# Patient Record
Sex: Female | Born: 1960 | Race: White | Hispanic: No | Marital: Single | Smoking: Never smoker
Health system: Southern US, Community
[De-identification: ages and names within clinical notes are randomized; demographics above are authoritative.]

## PROBLEM LIST (undated history)

## (undated) DIAGNOSIS — Z9221 Personal history of antineoplastic chemotherapy: Secondary | ICD-10-CM

## (undated) DIAGNOSIS — H269 Unspecified cataract: Secondary | ICD-10-CM

## (undated) DIAGNOSIS — R1310 Dysphagia, unspecified: Secondary | ICD-10-CM

## (undated) DIAGNOSIS — M1990 Unspecified osteoarthritis, unspecified site: Secondary | ICD-10-CM

## (undated) DIAGNOSIS — R45851 Suicidal ideations: Secondary | ICD-10-CM

## (undated) DIAGNOSIS — D0510 Intraductal carcinoma in situ of unspecified breast: Secondary | ICD-10-CM

## (undated) DIAGNOSIS — Z923 Personal history of irradiation: Secondary | ICD-10-CM

## (undated) DIAGNOSIS — K219 Gastro-esophageal reflux disease without esophagitis: Secondary | ICD-10-CM

## (undated) DIAGNOSIS — F32A Depression, unspecified: Secondary | ICD-10-CM

## (undated) DIAGNOSIS — C50919 Malignant neoplasm of unspecified site of unspecified female breast: Secondary | ICD-10-CM

## (undated) DIAGNOSIS — C419 Malignant neoplasm of bone and articular cartilage, unspecified: Secondary | ICD-10-CM

## (undated) HISTORY — DX: Suicidal ideations: R45.851

## (undated) HISTORY — PX: EYE SURGERY: SHX253

## (undated) HISTORY — DX: Malignant neoplasm of bone and articular cartilage, unspecified: C41.9

## (undated) HISTORY — DX: Unspecified osteoarthritis, unspecified site: M19.90

## (undated) HISTORY — DX: Gastro-esophageal reflux disease without esophagitis: K21.9

## (undated) HISTORY — PX: SPINE SURGERY: SHX786

## (undated) HISTORY — PX: BREAST LUMPECTOMY: SHX2

## (undated) HISTORY — DX: Intraductal carcinoma in situ of unspecified breast: D05.10

## (undated) HISTORY — DX: Depression, unspecified: F32.A

## (undated) HISTORY — PX: BACK SURGERY: SHX140

## (undated) HISTORY — DX: Dysphagia, unspecified: R13.10

## (undated) HISTORY — PX: BREAST BIOPSY: SHX20

## (undated) HISTORY — DX: Unspecified cataract: H26.9

## (undated) HISTORY — PX: BREAST SURGERY: SHX581

---

## 1998-02-04 ENCOUNTER — Ambulatory Visit (HOSPITAL_BASED_OUTPATIENT_CLINIC_OR_DEPARTMENT_OTHER): Admission: RE | Admit: 1998-02-04 | Discharge: 1998-02-04 | Payer: Self-pay

## 1998-02-14 ENCOUNTER — Ambulatory Visit (HOSPITAL_COMMUNITY): Admission: RE | Admit: 1998-02-14 | Discharge: 1998-02-14 | Payer: Self-pay

## 1998-10-22 ENCOUNTER — Encounter: Admission: RE | Admit: 1998-10-22 | Discharge: 1999-01-20 | Payer: Self-pay | Admitting: Radiation Oncology

## 1998-12-03 ENCOUNTER — Ambulatory Visit (HOSPITAL_COMMUNITY): Admission: RE | Admit: 1998-12-03 | Discharge: 1998-12-03 | Payer: Self-pay | Admitting: *Deleted

## 1999-02-27 ENCOUNTER — Ambulatory Visit (HOSPITAL_COMMUNITY): Admission: RE | Admit: 1999-02-27 | Discharge: 1999-02-27 | Payer: Self-pay | Admitting: Oncology

## 1999-02-27 ENCOUNTER — Encounter: Payer: Self-pay | Admitting: Oncology

## 1999-08-29 ENCOUNTER — Ambulatory Visit (HOSPITAL_COMMUNITY): Admission: RE | Admit: 1999-08-29 | Discharge: 1999-08-29 | Payer: Self-pay | Admitting: Oncology

## 1999-08-29 ENCOUNTER — Encounter: Payer: Self-pay | Admitting: Oncology

## 2000-04-13 ENCOUNTER — Other Ambulatory Visit: Admission: RE | Admit: 2000-04-13 | Discharge: 2000-04-13 | Payer: Self-pay | Admitting: Obstetrics and Gynecology

## 2000-05-17 ENCOUNTER — Encounter: Admission: RE | Admit: 2000-05-17 | Discharge: 2000-05-17 | Payer: Self-pay | Admitting: *Deleted

## 2000-05-17 ENCOUNTER — Encounter: Payer: Self-pay | Admitting: Oncology

## 2001-04-15 ENCOUNTER — Other Ambulatory Visit: Admission: RE | Admit: 2001-04-15 | Discharge: 2001-04-15 | Payer: Self-pay | Admitting: Obstetrics and Gynecology

## 2001-05-19 ENCOUNTER — Encounter: Payer: Self-pay | Admitting: Obstetrics and Gynecology

## 2001-05-19 ENCOUNTER — Encounter: Admission: RE | Admit: 2001-05-19 | Discharge: 2001-05-19 | Payer: Self-pay | Admitting: Obstetrics and Gynecology

## 2001-10-13 ENCOUNTER — Encounter: Payer: Self-pay | Admitting: Oncology

## 2001-10-13 ENCOUNTER — Ambulatory Visit (HOSPITAL_COMMUNITY): Admission: RE | Admit: 2001-10-13 | Discharge: 2001-10-13 | Payer: Self-pay | Admitting: Oncology

## 2001-12-01 ENCOUNTER — Encounter (INDEPENDENT_AMBULATORY_CARE_PROVIDER_SITE_OTHER): Payer: Self-pay | Admitting: Specialist

## 2001-12-01 ENCOUNTER — Ambulatory Visit (HOSPITAL_COMMUNITY): Admission: RE | Admit: 2001-12-01 | Discharge: 2001-12-01 | Payer: Self-pay | Admitting: Obstetrics and Gynecology

## 2002-05-22 ENCOUNTER — Encounter: Admission: RE | Admit: 2002-05-22 | Discharge: 2002-05-22 | Payer: Self-pay | Admitting: Oncology

## 2002-05-22 ENCOUNTER — Other Ambulatory Visit: Admission: RE | Admit: 2002-05-22 | Discharge: 2002-05-22 | Payer: Self-pay | Admitting: Diagnostic Radiology

## 2002-05-22 ENCOUNTER — Encounter: Payer: Self-pay | Admitting: Oncology

## 2002-05-22 ENCOUNTER — Encounter (INDEPENDENT_AMBULATORY_CARE_PROVIDER_SITE_OTHER): Payer: Self-pay | Admitting: *Deleted

## 2002-07-19 ENCOUNTER — Encounter: Admission: RE | Admit: 2002-07-19 | Discharge: 2002-07-19 | Payer: Self-pay | Admitting: *Deleted

## 2002-07-19 ENCOUNTER — Ambulatory Visit (HOSPITAL_BASED_OUTPATIENT_CLINIC_OR_DEPARTMENT_OTHER): Admission: RE | Admit: 2002-07-19 | Discharge: 2002-07-19 | Payer: Self-pay | Admitting: *Deleted

## 2002-07-19 ENCOUNTER — Encounter (INDEPENDENT_AMBULATORY_CARE_PROVIDER_SITE_OTHER): Payer: Self-pay | Admitting: *Deleted

## 2002-08-07 ENCOUNTER — Ambulatory Visit: Admission: RE | Admit: 2002-08-07 | Discharge: 2002-10-17 | Payer: Self-pay | Admitting: Radiation Oncology

## 2002-10-11 ENCOUNTER — Encounter: Admission: RE | Admit: 2002-10-11 | Discharge: 2002-10-11 | Payer: Self-pay | Admitting: Oncology

## 2002-10-11 ENCOUNTER — Encounter: Payer: Self-pay | Admitting: Oncology

## 2003-05-09 ENCOUNTER — Ambulatory Visit (HOSPITAL_COMMUNITY): Admission: RE | Admit: 2003-05-09 | Discharge: 2003-05-09 | Payer: Self-pay | Admitting: Obstetrics and Gynecology

## 2003-05-09 ENCOUNTER — Encounter (INDEPENDENT_AMBULATORY_CARE_PROVIDER_SITE_OTHER): Payer: Self-pay | Admitting: Specialist

## 2003-05-24 ENCOUNTER — Encounter: Payer: Self-pay | Admitting: Oncology

## 2003-05-24 ENCOUNTER — Encounter: Admission: RE | Admit: 2003-05-24 | Discharge: 2003-05-24 | Payer: Self-pay | Admitting: Oncology

## 2005-02-19 ENCOUNTER — Encounter: Admission: RE | Admit: 2005-02-19 | Discharge: 2005-02-19 | Payer: Self-pay | Admitting: Oncology

## 2005-02-24 ENCOUNTER — Ambulatory Visit (HOSPITAL_COMMUNITY): Admission: RE | Admit: 2005-02-24 | Discharge: 2005-02-24 | Payer: Self-pay | Admitting: Oncology

## 2005-03-02 ENCOUNTER — Ambulatory Visit: Payer: Self-pay | Admitting: Oncology

## 2005-09-07 ENCOUNTER — Ambulatory Visit: Payer: Self-pay | Admitting: Oncology

## 2006-02-24 ENCOUNTER — Encounter: Admission: RE | Admit: 2006-02-24 | Discharge: 2006-02-24 | Payer: Self-pay | Admitting: Oncology

## 2006-03-05 ENCOUNTER — Ambulatory Visit: Payer: Self-pay | Admitting: Oncology

## 2006-03-08 LAB — CBC WITH DIFFERENTIAL/PLATELET
Eosinophils Absolute: 0.2 10*3/uL (ref 0.0–0.5)
LYMPH%: 25.4 % (ref 14.0–48.0)
MCHC: 33.5 g/dL (ref 32.0–36.0)
MCV: 80.2 fL — ABNORMAL LOW (ref 81.0–101.0)
MONO%: 7.4 % (ref 0.0–13.0)
NEUT#: 3.2 10*3/uL (ref 1.5–6.5)
NEUT%: 62.1 % (ref 39.6–76.8)
Platelets: 213 10*3/uL (ref 145–400)
RBC: 4.57 10*6/uL (ref 3.70–5.32)

## 2006-03-08 LAB — COMPREHENSIVE METABOLIC PANEL
ALT: 20 U/L (ref 0–40)
Alkaline Phosphatase: 75 U/L (ref 39–117)
CO2: 29 mEq/L (ref 19–32)
Creatinine, Ser: 0.91 mg/dL (ref 0.40–1.20)
Sodium: 141 mEq/L (ref 135–145)
Total Bilirubin: 0.3 mg/dL (ref 0.3–1.2)
Total Protein: 7.3 g/dL (ref 6.0–8.3)

## 2006-03-08 LAB — LACTATE DEHYDROGENASE: LDH: 179 U/L (ref 94–250)

## 2006-03-18 ENCOUNTER — Ambulatory Visit (HOSPITAL_COMMUNITY): Admission: RE | Admit: 2006-03-18 | Discharge: 2006-03-18 | Payer: Self-pay | Admitting: Oncology

## 2006-09-03 ENCOUNTER — Ambulatory Visit: Payer: Self-pay | Admitting: Oncology

## 2006-09-07 LAB — COMPREHENSIVE METABOLIC PANEL
AST: 16 U/L (ref 0–37)
Albumin: 4.4 g/dL (ref 3.5–5.2)
Alkaline Phosphatase: 85 U/L (ref 39–117)
Potassium: 4.4 mEq/L (ref 3.5–5.3)
Sodium: 140 mEq/L (ref 135–145)
Total Protein: 7.2 g/dL (ref 6.0–8.3)

## 2006-09-07 LAB — CBC WITH DIFFERENTIAL/PLATELET
EOS%: 2.5 % (ref 0.0–7.0)
MCH: 26.9 pg (ref 26.0–34.0)
MCV: 80.3 fL — ABNORMAL LOW (ref 81.0–101.0)
MONO%: 6.3 % (ref 0.0–13.0)
NEUT#: 6.5 10*3/uL (ref 1.5–6.5)
RBC: 4.58 10*6/uL (ref 3.70–5.32)
RDW: 14.2 % (ref 11.3–14.5)

## 2007-02-28 ENCOUNTER — Encounter: Admission: RE | Admit: 2007-02-28 | Discharge: 2007-02-28 | Payer: Self-pay | Admitting: Oncology

## 2007-03-03 ENCOUNTER — Ambulatory Visit: Payer: Self-pay | Admitting: Oncology

## 2007-03-08 ENCOUNTER — Ambulatory Visit (HOSPITAL_COMMUNITY): Admission: RE | Admit: 2007-03-08 | Discharge: 2007-03-08 | Payer: Self-pay | Admitting: Oncology

## 2007-03-08 LAB — CBC WITH DIFFERENTIAL/PLATELET
Basophils Absolute: 0 10*3/uL (ref 0.0–0.1)
HCT: 36.2 % (ref 34.8–46.6)
HGB: 12.5 g/dL (ref 11.6–15.9)
LYMPH%: 21 % (ref 14.0–48.0)
MCH: 27.1 pg (ref 26.0–34.0)
MCHC: 34.5 g/dL (ref 32.0–36.0)
MONO#: 0.6 10*3/uL (ref 0.1–0.9)
NEUT%: 67.9 % (ref 39.6–76.8)
Platelets: 226 10*3/uL (ref 145–400)
WBC: 7.8 10*3/uL (ref 3.9–10.0)
lymph#: 1.6 10*3/uL (ref 0.9–3.3)

## 2007-03-08 LAB — COMPREHENSIVE METABOLIC PANEL
BUN: 17 mg/dL (ref 6–23)
CO2: 26 mEq/L (ref 19–32)
Calcium: 9.6 mg/dL (ref 8.4–10.5)
Chloride: 100 mEq/L (ref 96–112)
Creatinine, Ser: 0.91 mg/dL (ref 0.40–1.20)
Total Bilirubin: 0.3 mg/dL (ref 0.3–1.2)

## 2007-03-08 LAB — LACTATE DEHYDROGENASE: LDH: 184 U/L (ref 94–250)

## 2007-03-21 ENCOUNTER — Ambulatory Visit (HOSPITAL_COMMUNITY): Admission: RE | Admit: 2007-03-21 | Discharge: 2007-03-21 | Payer: Self-pay | Admitting: Family Medicine

## 2008-03-01 ENCOUNTER — Encounter: Admission: RE | Admit: 2008-03-01 | Discharge: 2008-03-01 | Payer: Self-pay | Admitting: Oncology

## 2008-03-02 ENCOUNTER — Ambulatory Visit: Payer: Self-pay | Admitting: Oncology

## 2008-03-06 ENCOUNTER — Ambulatory Visit (HOSPITAL_COMMUNITY): Admission: RE | Admit: 2008-03-06 | Discharge: 2008-03-06 | Payer: Self-pay | Admitting: Oncology

## 2008-03-06 LAB — CBC WITH DIFFERENTIAL/PLATELET
BASO%: 0.4 % (ref 0.0–2.0)
EOS%: 3.7 % (ref 0.0–7.0)
HCT: 39.4 % (ref 34.8–46.6)
HGB: 13.3 g/dL (ref 11.6–15.9)
MCHC: 33.7 g/dL (ref 32.0–36.0)
MONO#: 0.5 10*3/uL (ref 0.1–0.9)
MONO%: 7.3 % (ref 0.0–13.0)
NEUT#: 4.3 10*3/uL (ref 1.5–6.5)
NEUT%: 57.9 % (ref 39.6–76.8)
Platelets: 238 10*3/uL (ref 145–400)
lymph#: 2.3 10*3/uL (ref 0.9–3.3)

## 2008-03-06 LAB — COMPREHENSIVE METABOLIC PANEL
Albumin: 4.3 g/dL (ref 3.5–5.2)
CO2: 26 mEq/L (ref 19–32)
Calcium: 10.1 mg/dL (ref 8.4–10.5)
Chloride: 100 mEq/L (ref 96–112)
Creatinine, Ser: 0.86 mg/dL (ref 0.40–1.20)
Glucose, Bld: 83 mg/dL (ref 70–99)

## 2009-03-04 ENCOUNTER — Encounter: Admission: RE | Admit: 2009-03-04 | Discharge: 2009-03-04 | Payer: Self-pay | Admitting: Oncology

## 2009-03-08 ENCOUNTER — Ambulatory Visit: Payer: Self-pay | Admitting: Oncology

## 2009-03-12 LAB — CBC WITH DIFFERENTIAL/PLATELET
EOS%: 3 % (ref 0.0–7.0)
HCT: 37.8 % (ref 34.8–46.6)
HGB: 12.8 g/dL (ref 11.6–15.9)
MCH: 27.1 pg (ref 25.1–34.0)
MCV: 79.8 fL (ref 79.5–101.0)
MONO%: 5.8 % (ref 0.0–14.0)
NEUT#: 4.2 10*3/uL (ref 1.5–6.5)
Platelets: 216 10*3/uL (ref 145–400)
RBC: 4.74 10*6/uL (ref 3.70–5.45)

## 2009-03-12 LAB — COMPREHENSIVE METABOLIC PANEL
ALT: 16 U/L (ref 0–35)
Albumin: 4.2 g/dL (ref 3.5–5.2)
BUN: 18 mg/dL (ref 6–23)
CO2: 25 mEq/L (ref 19–32)
Creatinine, Ser: 0.96 mg/dL (ref 0.40–1.20)
Sodium: 141 mEq/L (ref 135–145)
Total Bilirubin: 0.2 mg/dL — ABNORMAL LOW (ref 0.3–1.2)
Total Protein: 7.3 g/dL (ref 6.0–8.3)

## 2009-03-12 LAB — LACTATE DEHYDROGENASE: LDH: 157 U/L (ref 94–250)

## 2009-06-27 ENCOUNTER — Ambulatory Visit: Payer: Self-pay | Admitting: Diagnostic Radiology

## 2009-06-27 ENCOUNTER — Emergency Department (HOSPITAL_BASED_OUTPATIENT_CLINIC_OR_DEPARTMENT_OTHER): Admission: EM | Admit: 2009-06-27 | Discharge: 2009-06-27 | Payer: Self-pay | Admitting: Emergency Medicine

## 2010-03-07 ENCOUNTER — Encounter: Admission: RE | Admit: 2010-03-07 | Discharge: 2010-03-07 | Payer: Self-pay | Admitting: Oncology

## 2010-03-21 ENCOUNTER — Ambulatory Visit: Payer: Self-pay | Admitting: Oncology

## 2010-03-21 LAB — CBC WITH DIFFERENTIAL/PLATELET
BASO%: 0.4 % (ref 0.0–2.0)
Basophils Absolute: 0 10*3/uL (ref 0.0–0.1)
HGB: 11.9 g/dL (ref 11.6–15.9)
LYMPH%: 23.1 % (ref 14.0–49.7)
MCHC: 32.9 g/dL (ref 31.5–36.0)
MONO#: 0.4 10*3/uL (ref 0.1–0.9)
MONO%: 6.4 % (ref 0.0–14.0)
NEUT#: 3.9 10*3/uL (ref 1.5–6.5)
NEUT%: 67.1 % (ref 38.4–76.8)

## 2010-03-22 LAB — COMPREHENSIVE METABOLIC PANEL
Alkaline Phosphatase: 61 U/L (ref 39–117)
BUN: 23 mg/dL (ref 6–23)
CO2: 23 mEq/L (ref 19–32)
Potassium: 4 mEq/L (ref 3.5–5.3)
Sodium: 141 mEq/L (ref 135–145)
Total Protein: 7 g/dL (ref 6.0–8.3)

## 2010-03-22 LAB — LACTATE DEHYDROGENASE: LDH: 188 U/L (ref 94–250)

## 2010-03-22 LAB — VITAMIN D 25 HYDROXY (VIT D DEFICIENCY, FRACTURES): Vit D, 25-Hydroxy: 30 ng/mL (ref 30–89)

## 2010-10-18 ENCOUNTER — Other Ambulatory Visit: Payer: Self-pay | Admitting: Oncology

## 2010-10-18 DIAGNOSIS — Z1239 Encounter for other screening for malignant neoplasm of breast: Secondary | ICD-10-CM

## 2010-10-19 ENCOUNTER — Encounter: Payer: Self-pay | Admitting: Oncology

## 2010-10-20 ENCOUNTER — Encounter: Payer: Self-pay | Admitting: Oncology

## 2011-02-13 NOTE — Op Note (Signed)
   NAME:  Rhonda Keller, Rhonda Keller                          ACCOUNT NO.:  [id number]   MEDICAL RECORD NO.:  [id number]                   PATIENT TYPE:  AMB   LOCATION:  DSC                                  FACILITY:  [facility]   PHYSICIAN:  Vikki Ports, M.D.         DATE OF BIRTH:  10-16-1960   DATE OF PROCEDURE:  07/19/2002  DATE OF DISCHARGE:  07/19/2002                                 OPERATIVE REPORT   PREOPERATIVE DIAGNOSIS:  Left breast cancer.   POSTOPERATIVE DIAGNOSIS:  Left breast cancer.   PROCEDURES:  1. Needle-localization left partial mastectomy.  2. Lymphatic mapping.  3. Blue dye injection.  4. Left axillary sentinel lymph node biopsy.   SURGEON:  Vikki Ports, M.D.   ANESTHESIA:  General.   DESCRIPTION OF PROCEDURE:  The patient was taken to the operating room and  placed in the supine position.  After adequate general anesthesia was  induced, 5 cc of Lymphazurin blue dye was injected in the retroareolar  position.  Using the Neoprobe, an area of high activity was identified in  the left axilla.  A transverse incision was made just lateral to the lateral  border of the pectoralis major muscle.  I dissected down through the  subcutaneous fat and retracted the lateral border of the pectoralis muscle  medially.  Two separate blue, hot lymph nodes were identified and excised.  Small lymphatic tributaries were clipped and coagulated.  Lymph nodes were  sent for pathologic evaluation after verification of the Neoprobe had very  high counts in the 3000-4000 range.  Touch prep biopsy came back negative  for malignancy.  That wound was closed then with a subcuticular 4-0  Monocryl.   The area that had previously been localized by a wire was identified.  A  curvilinear incision was made in the lower inner quadrant of the left  breast.  I dissected down, excising all tissue around the wire en bloc.  This was sent for  specimen mammography, which verified the  presence of the density.  Adequate  hemostasis was ensured, and this incision was closed with subcuticular 4-0  Monocryl.  Steri-Strips and sterile dressings were applied.  The patient  tolerated the procedure well and went to the PACU in good condition.                                                Vikki Ports, M.D.    KRH/MEDQ  D:  07/22/2002  T:  07/24/2002  Job:  [id number]   cc:   Genene Churn. Cyndie Chime, M.D.  [address] [facility]  [place]  [place] [address]  Fax: [phone]

## 2011-02-13 NOTE — Op Note (Signed)
[facility]  Patient:    [name], [name] Visit Number: [id number] MRN: [id number]          Service Type: DSU Location: DAY Attending Physician:  Rosalee Kaufman Dictated by:   Harl Bowie, M.D. Proc. Date: 12/01/01 Admit Date:  12/01/2001   CC:         Genene Churn. Cyndie Chime, M.D.  Loyal Jacobson, M.D.   Operative Report  PREOPERATIVE DIAGNOSIS:  Postmenopausal bleeding.  POSTOPERATIVE DIAGNOSIS:  Postmenopausal bleeding.  OPERATION:  D&C.  SURGEON:  Harl Bowie, M.D.  ANESTHESIA:  MAC.  FINDINGS AND PROCEDURE:  The patient prepped and draped in the usual fashion for a vaginal procedure.  The patient examined and found to have a uterus of normal size with the adnexa clear.  Following, with some difficulty, a Semb speculum was placed in the vagina.  The cervix was grasped with a single-tooth tenaculum.  With a very small dilator, the cervix was sounded to 2.5 inches. The cervix was dilated up to a size 15 dilator and the cavity entered with a small curette.  A small amount of tissue was obtained.  No additional tissue was obtained with a Novak curette.  Blood loss during the procedure was minimal.  The patient tolerated the procedure well and sent to the recovery room in good condition. Dictated by:   Harl Bowie, M.D. Attending Physician:  Rosalee Kaufman DD:  12/01/01 TD:  12/01/01 Job: [id number]

## 2011-02-13 NOTE — Op Note (Signed)
   NAME:  Rhonda Keller, Rhonda Keller                          ACCOUNT NO.:  [id number]   MEDICAL RECORD NO.:  [id number]                   PATIENT TYPE:  AMB   LOCATION:  DAY                                  FACILITY:  [facility]   PHYSICIAN:  Leona Singleton, M.D.                  DATE OF BIRTH:  12/18/1960   DATE OF PROCEDURE:  05/09/2003  DATE OF DISCHARGE:                                 OPERATIVE REPORT   PREOPERATIVE DIAGNOSIS:  Postmenopausal bleeding.   POSTOPERATIVE DIAGNOSIS:  Postmenopausal bleeding.   OPERATION/PROCEDURE:  Dilatation and curettage.   SURGEON:  Leona Singleton, M.D.   ANESTHESIA:  MAC with local.   DESCRIPTION OF PROCEDURE:  The patient was prepped and draped in the usual  fashion for a vaginal procedure.  The patient was examined to have uterus  normal size with adnexa clear.  Following this, a speculum was placed in the  vagina and cervix was grasped with a single-tooth tenaculum.  Cervix was  then sounded to three inches.  Cervix was dilated and the cavity was entered  with a small curet.  Small amount of tissue was obtained.  No additional was  obtained with the round Stone forceps and serrated curet.  The patient  tolerated the procedure well.  Blood loss was minimal.  The patient was sent  to the recovery room in good condition.                                               Leona Singleton, M.D.    KB/MEDQ  D:  05/09/2003  T:  05/09/2003  Job:  [id number]

## 2011-03-10 ENCOUNTER — Ambulatory Visit
Admission: RE | Admit: 2011-03-10 | Discharge: 2011-03-10 | Disposition: A | Discharge status: Home / Self Care | Payer: 59 | Source: Ambulatory Visit | Attending: Oncology | Admitting: Oncology

## 2011-03-10 DIAGNOSIS — Z1239 Encounter for other screening for malignant neoplasm of breast: Secondary | ICD-10-CM

## 2011-03-24 ENCOUNTER — Other Ambulatory Visit: Payer: Self-pay | Admitting: Oncology

## 2011-03-24 ENCOUNTER — Ambulatory Visit: Payer: Self-pay

## 2011-03-24 ENCOUNTER — Encounter (HOSPITAL_BASED_OUTPATIENT_CLINIC_OR_DEPARTMENT_OTHER): Payer: 59 | Admitting: Oncology

## 2011-03-24 DIAGNOSIS — Z171 Estrogen receptor negative status [ER-]: Secondary | ICD-10-CM

## 2011-03-24 DIAGNOSIS — C50919 Malignant neoplasm of unspecified site of unspecified female breast: Secondary | ICD-10-CM

## 2011-03-24 LAB — CBC WITH DIFFERENTIAL/PLATELET
EOS%: 2.7 % (ref 0.0–7.0)
Eosinophils Absolute: 0.2 10*3/uL (ref 0.0–0.5)
MCH: 25.7 pg (ref 25.1–34.0)
MCHC: 32.7 g/dL (ref 31.5–36.0)
MONO#: 0.4 10*3/uL (ref 0.1–0.9)
MONO%: 5.6 % (ref 0.0–14.0)
RBC: 4.72 10*6/uL (ref 3.70–5.45)
RDW: 15.1 % — ABNORMAL HIGH (ref 11.2–14.5)
WBC: 7.1 10*3/uL (ref 3.9–10.3)
lymph#: 1.2 10*3/uL (ref 0.9–3.3)

## 2011-03-24 LAB — COMPREHENSIVE METABOLIC PANEL
CO2: 28 mEq/L (ref 19–32)
Chloride: 103 mEq/L (ref 96–112)
Glucose, Bld: 105 mg/dL — ABNORMAL HIGH (ref 70–99)
Potassium: 4.5 mEq/L (ref 3.5–5.3)

## 2011-03-24 LAB — LACTATE DEHYDROGENASE: LDH: 190 U/L (ref 94–250)

## 2011-03-31 ENCOUNTER — Encounter (HOSPITAL_BASED_OUTPATIENT_CLINIC_OR_DEPARTMENT_OTHER): Payer: 59 | Admitting: Oncology

## 2011-03-31 ENCOUNTER — Other Ambulatory Visit: Payer: Self-pay | Admitting: Oncology

## 2011-03-31 DIAGNOSIS — Z1231 Encounter for screening mammogram for malignant neoplasm of breast: Secondary | ICD-10-CM

## 2011-03-31 DIAGNOSIS — C412 Malignant neoplasm of vertebral column: Secondary | ICD-10-CM

## 2011-03-31 DIAGNOSIS — C50919 Malignant neoplasm of unspecified site of unspecified female breast: Secondary | ICD-10-CM

## 2011-11-02 ENCOUNTER — Telehealth: Payer: Self-pay | Admitting: Oncology

## 2011-11-02 NOTE — Telephone Encounter (Signed)
lmonvm adviisng the pt of her July 2013 appt °

## 2012-03-14 ENCOUNTER — Ambulatory Visit: Payer: 59

## 2012-03-17 ENCOUNTER — Ambulatory Visit
Admission: RE | Admit: 2012-03-17 | Discharge: 2012-03-17 | Disposition: A | Discharge status: Home / Self Care | Payer: PRIVATE HEALTH INSURANCE | Source: Ambulatory Visit | Attending: Oncology | Admitting: Oncology

## 2012-03-17 DIAGNOSIS — Z1231 Encounter for screening mammogram for malignant neoplasm of breast: Secondary | ICD-10-CM

## 2012-03-23 ENCOUNTER — Other Ambulatory Visit (HOSPITAL_BASED_OUTPATIENT_CLINIC_OR_DEPARTMENT_OTHER): Payer: PRIVATE HEALTH INSURANCE | Admitting: Lab

## 2012-03-23 DIAGNOSIS — C50919 Malignant neoplasm of unspecified site of unspecified female breast: Secondary | ICD-10-CM

## 2012-03-23 LAB — COMPREHENSIVE METABOLIC PANEL
Albumin: 4.4 g/dL (ref 3.5–5.2)
BUN: 16 mg/dL (ref 6–23)
CO2: 26 mEq/L (ref 19–32)
Glucose, Bld: 84 mg/dL (ref 70–99)
Potassium: 4.3 mEq/L (ref 3.5–5.3)
Sodium: 140 mEq/L (ref 135–145)
Total Bilirubin: 0.3 mg/dL (ref 0.3–1.2)
Total Protein: 7.3 g/dL (ref 6.0–8.3)

## 2012-03-23 LAB — CBC WITH DIFFERENTIAL/PLATELET
Basophils Absolute: 0.1 10*3/uL (ref 0.0–0.1)
Eosinophils Absolute: 0.2 10*3/uL (ref 0.0–0.5)
HGB: 11.7 g/dL (ref 11.6–15.9)
LYMPH%: 24.9 % (ref 14.0–49.7)
MCV: 79.1 fL — ABNORMAL LOW (ref 79.5–101.0)
MONO#: 0.5 10*3/uL (ref 0.1–0.9)
NEUT#: 4.4 10*3/uL (ref 1.5–6.5)
Platelets: 199 10*3/uL (ref 145–400)
RBC: 4.65 10*6/uL (ref 3.70–5.45)
WBC: 6.8 10*3/uL (ref 3.9–10.3)

## 2012-03-23 LAB — LACTATE DEHYDROGENASE: LDH: 160 U/L (ref 94–250)

## 2012-03-29 ENCOUNTER — Encounter: Payer: Self-pay | Admitting: Oncology

## 2012-03-29 ENCOUNTER — Ambulatory Visit (HOSPITAL_BASED_OUTPATIENT_CLINIC_OR_DEPARTMENT_OTHER): Payer: PRIVATE HEALTH INSURANCE | Admitting: Oncology

## 2012-03-29 ENCOUNTER — Telehealth: Payer: Self-pay | Admitting: Oncology

## 2012-03-29 VITALS — BP 126/63 | HR 86 | Temp 97.7°F | Ht 62.0 in | Wt 210.0 lb

## 2012-03-29 DIAGNOSIS — R1310 Dysphagia, unspecified: Secondary | ICD-10-CM

## 2012-03-29 DIAGNOSIS — D0590 Unspecified type of carcinoma in situ of unspecified breast: Secondary | ICD-10-CM

## 2012-03-29 DIAGNOSIS — C419 Malignant neoplasm of bone and articular cartilage, unspecified: Secondary | ICD-10-CM

## 2012-03-29 DIAGNOSIS — C50919 Malignant neoplasm of unspecified site of unspecified female breast: Secondary | ICD-10-CM | POA: Insufficient documentation

## 2012-03-29 DIAGNOSIS — D0510 Intraductal carcinoma in situ of unspecified breast: Secondary | ICD-10-CM

## 2012-03-29 HISTORY — DX: Malignant neoplasm of bone and articular cartilage, unspecified: C41.9

## 2012-03-29 HISTORY — DX: Intraductal carcinoma in situ of unspecified breast: D05.10

## 2012-03-29 HISTORY — DX: Dysphagia, unspecified: R13.10

## 2012-03-29 NOTE — Progress Notes (Signed)
Hematology and Oncology Follow Up Visit  Rhonda Keller [id number] [birth year] 51 y.o. 03/29/2012 7:08 PM   Principle Diagnosis: Encounter Diagnoses  Name Primary?  . Stage II infiltrating ductal breast carcinoma, ER+ Yes  . DCIS (ductal carcinoma in situ) of breast   . Ewing's sarcoma of bone   . Dysphagia, idiopathic      Interim History:  Followup visit for this pleasant 51 year old woman with an i initial diagnosis of  Ewing's sarcoma,  presenting with impending spinal cord compression due to an epidural mass in September 15, 1984, treated with surgery, radiation, and chemotherapy while she was living in [place].  She subsequently developed a stage II 2.5 cm, ER negative, 1 node positive cancer of the right breast in Feb 13, 1998, treated with lumpectomy, radiation, and chemotherapy with 4 cycles of Taxol followed by 6 cycles of CMF.  She developed a second primary 1.1-cm DCIS cancer in the left breast in August 2003.  ER/PR negative.  Node negative treated with breast radiation. She has had no signs of cancer now for almost 10 years. At time of visit last year she was complaining of dysphagia. She did see a gastroenterologist but could not afford to have endoscopy done. She still having some dysphasia intermittent for solids and times liquids. I told her that we could do a simple barium swallow to see if she in fact has developed a radiation stricture. She wanted getting bilateral breast radiation for her metachronous primary cancers. She is agreeable to this.  She's had no other interim medical problems. She denies any headache or change in vision, bone pain, vaginal bleeding, or change in bowel habit.  Medications: reviewed  Allergies:  Allergies  Allergen Reactions  . Penicillins Itching    Review of Systems: Constitutional:   No constitutional symptoms Respiratory: No cough or dyspnea Cardiovascular:  No chest pain or palpitations Gastrointestinal: No change in bowel  habit Genito-Urinary: See above Musculoskeletal: See above Neurologic: See above Skin: No rash Remaining ROS negative.  Physical Exam: Blood pressure 126/63, pulse 86, temperature 97.7 F (36.5 C), temperature source Oral, height 5\' 2"  (1.575 m), weight 210 lb (95.255 kg). Wt Readings from Last 3 Encounters:  03/29/12 210 lb (95.255 kg)     General appearance: Well-nourished Caucasian woman HENNT: Pharynx no erythema or exudate Lymph nodes: No cervical, supraclavicular, or axillary adenopathy Breasts: Surgical changes bilaterally but no dominant mass Lungs: Clear to auscultation resonant to percussion Heart: Regular rhythm no murmur Abdomen: Soft nontender no mass no organomegaly Extremities: No edema no calf tenderness Vascular: No cyanosis Neurologic: Mental status intact, cranial nerves intact, motor strength 5 over 5, reflexes absent symmetric at the knees, 1+ symmetric at the biceps Skin: No rash or ecchymosis  Lab Results: Lab Results  Component Value Date   WBC 6.8 03/23/2012   HGB 11.7 03/23/2012   HCT 36.8 03/23/2012   MCV 79.1* 03/23/2012   PLT 199 03/23/2012     Chemistry      Component Value Date/Time   NA 140 03/23/2012 1343   K 4.3 03/23/2012 1343   CL 103 03/23/2012 1343   CO2 26 03/23/2012 1343   BUN 16 03/23/2012 1343   CREATININE 0.77 03/23/2012 1343      Component Value Date/Time   CALCIUM 9.8 03/23/2012 1343   ALKPHOS 64 03/23/2012 1343   AST 17 03/23/2012 1343   ALT 14 03/23/2012 1343   BILITOT 0.3 03/23/2012 1343       Radiological Studies: Mm  Digital Screening  03/17/2012  *RADIOLOGY REPORT*  Clinical Data: Screening.  DIGITAL SCREENING MAMMOGRAM WITH CAD  Comparison:  Previous exams  Findings:  There are scattered fibroglandular densities. No suspicious masses, architectural distortion, or calcifications are present.  Images were processed with CAD.  IMPRESSION: No specific mammographic evidence of malignancy.  A result letter of this screening  mammogram will be mailed directly to the patient.  RECOMMENDATION: Screening mammogram in one year. (Code:SM-B-01Y)  BI-RADS CATEGORY 2:  Benign finding(s).  Original Report Authenticated By: Sherian Rein, M.D.    Impression and Plan: #1. Metachronous primary invasive and noninvasive bilateral breast cancers treated as outlined above. She remains free of any new disease at this time. Plan: Continue annual exam and mammograms  #2. History of treated and Ewing's sarcoma  #3. Intermittent dysphagia. Without radiation stricture. Plan: Barium swallow   CC:. Dr. Carrington Clamp; Dr. Neil Crouch   Levert Feinstein, MD 7/2/20137:08 PM

## 2012-03-29 NOTE — Telephone Encounter (Signed)
appts made and printed for pt aom °

## 2012-04-06 ENCOUNTER — Telehealth: Payer: Self-pay | Admitting: *Deleted

## 2012-04-06 NOTE — Telephone Encounter (Signed)
Received call from [facility] Speech Dept @ [phone] asking if Dr. Cyndie Chime really wanted a modified Barium Swallow or just a barium Swallow on this pt.  She is scheduled for 03/14/12 @ 10 am & just wants to make sure what he wants.  Discussed with Dr Cyndie Chime & he wants a regular barium swallow to evaluate for dysphagia.  Message left on vm with Toniann Fail with order.

## 2012-04-07 ENCOUNTER — Other Ambulatory Visit: Payer: Self-pay | Admitting: *Deleted

## 2012-04-07 DIAGNOSIS — R1310 Dysphagia, unspecified: Secondary | ICD-10-CM

## 2012-04-07 DIAGNOSIS — C50919 Malignant neoplasm of unspecified site of unspecified female breast: Secondary | ICD-10-CM

## 2012-04-13 ENCOUNTER — Ambulatory Visit (HOSPITAL_COMMUNITY): Admission: RE | Admit: 2012-04-13 | Payer: PRIVATE HEALTH INSURANCE | Source: Ambulatory Visit

## 2012-04-13 ENCOUNTER — Other Ambulatory Visit (HOSPITAL_COMMUNITY): Payer: PRIVATE HEALTH INSURANCE

## 2012-05-04 ENCOUNTER — Other Ambulatory Visit (HOSPITAL_COMMUNITY): Payer: PRIVATE HEALTH INSURANCE

## 2012-06-29 ENCOUNTER — Ambulatory Visit (HOSPITAL_COMMUNITY)
Admission: RE | Admit: 2012-06-29 | Discharge: 2012-06-29 | Disposition: A | Discharge status: Home / Self Care | Payer: PRIVATE HEALTH INSURANCE | Source: Ambulatory Visit | Attending: Oncology | Admitting: Oncology

## 2012-06-29 ENCOUNTER — Other Ambulatory Visit (HOSPITAL_COMMUNITY): Payer: PRIVATE HEALTH INSURANCE

## 2012-06-29 DIAGNOSIS — C50919 Malignant neoplasm of unspecified site of unspecified female breast: Secondary | ICD-10-CM

## 2012-06-29 DIAGNOSIS — Z853 Personal history of malignant neoplasm of breast: Secondary | ICD-10-CM | POA: Insufficient documentation

## 2012-06-29 DIAGNOSIS — R1310 Dysphagia, unspecified: Secondary | ICD-10-CM | POA: Insufficient documentation

## 2012-06-29 DIAGNOSIS — K449 Diaphragmatic hernia without obstruction or gangrene: Secondary | ICD-10-CM | POA: Insufficient documentation

## 2012-06-30 ENCOUNTER — Telehealth: Payer: Self-pay | Admitting: *Deleted

## 2012-06-30 NOTE — Telephone Encounter (Signed)
Message copied by Gala Romney on Thu Jun 30, 2012  3:31 PM ------      Message from: Levert Feinstein      Created: Wed Jun 29, 2012  6:38 PM       Call pt: upper GI shows significant reflux; no obstruction; no tumor.  Would use prilosec or similar drug twice daily for 6 weeks then once a day

## 2012-07-01 ENCOUNTER — Telehealth: Payer: Self-pay | Admitting: *Deleted

## 2012-07-01 NOTE — Telephone Encounter (Signed)
Message copied by Orbie Hurst on Fri Jul 01, 2012  3:54 PM ------      Message from: Levert Feinstein      Created: Wed Jun 29, 2012  6:38 PM       Call pt: upper GI shows significant reflux; no obstruction; no tumor.  Would use prilosec or similar drug twice daily for 6 weeks then once a day

## 2012-07-01 NOTE — Telephone Encounter (Signed)
Called patient and let her know that upper GI shows significant reflux, but no obstruction or tumor.  Dr. Cyndie Chime suggests using prilosec or a similar drug twice daily for 6 weeks then once daily. She appreciate the phone call and advise.

## 2013-03-22 ENCOUNTER — Ambulatory Visit
Admission: RE | Admit: 2013-03-22 | Discharge: 2013-03-22 | Disposition: A | Discharge status: Home / Self Care | Payer: PRIVATE HEALTH INSURANCE | Source: Ambulatory Visit | Attending: Oncology | Admitting: Oncology

## 2013-03-22 DIAGNOSIS — C419 Malignant neoplasm of bone and articular cartilage, unspecified: Secondary | ICD-10-CM

## 2013-03-22 DIAGNOSIS — C50919 Malignant neoplasm of unspecified site of unspecified female breast: Secondary | ICD-10-CM

## 2013-03-22 DIAGNOSIS — D0510 Intraductal carcinoma in situ of unspecified breast: Secondary | ICD-10-CM

## 2013-03-28 ENCOUNTER — Other Ambulatory Visit (HOSPITAL_BASED_OUTPATIENT_CLINIC_OR_DEPARTMENT_OTHER): Payer: PRIVATE HEALTH INSURANCE | Admitting: Lab

## 2013-03-28 DIAGNOSIS — D0510 Intraductal carcinoma in situ of unspecified breast: Secondary | ICD-10-CM

## 2013-03-28 DIAGNOSIS — Z170 Estrogen receptor positive status [ER+]: Secondary | ICD-10-CM

## 2013-03-28 DIAGNOSIS — C50919 Malignant neoplasm of unspecified site of unspecified female breast: Secondary | ICD-10-CM

## 2013-03-28 DIAGNOSIS — C419 Malignant neoplasm of bone and articular cartilage, unspecified: Secondary | ICD-10-CM

## 2013-03-28 DIAGNOSIS — D0590 Unspecified type of carcinoma in situ of unspecified breast: Secondary | ICD-10-CM

## 2013-03-28 LAB — COMPREHENSIVE METABOLIC PANEL (CC13)
ALT: 19 U/L (ref 0–55)
AST: 22 U/L (ref 5–34)
Albumin: 3.6 g/dL (ref 3.5–5.0)
BUN: 22.3 mg/dL (ref 7.0–26.0)
Calcium: 10.2 mg/dL (ref 8.4–10.4)
Chloride: 104 mEq/L (ref 98–109)
Potassium: 4.3 mEq/L (ref 3.5–5.1)
Sodium: 143 mEq/L (ref 136–145)
Total Protein: 7.5 g/dL (ref 6.4–8.3)

## 2013-03-28 LAB — CBC WITH DIFFERENTIAL/PLATELET
BASO%: 0.5 % (ref 0.0–2.0)
Basophils Absolute: 0 10*3/uL (ref 0.0–0.1)
EOS%: 4 % (ref 0.0–7.0)
HGB: 12 g/dL (ref 11.6–15.9)
MCH: 25.4 pg (ref 25.1–34.0)
NEUT#: 4.1 10*3/uL (ref 1.5–6.5)
RDW: 14.8 % — ABNORMAL HIGH (ref 11.2–14.5)
lymph#: 1.4 10*3/uL (ref 0.9–3.3)

## 2013-04-04 ENCOUNTER — Ambulatory Visit (HOSPITAL_BASED_OUTPATIENT_CLINIC_OR_DEPARTMENT_OTHER): Payer: PRIVATE HEALTH INSURANCE | Admitting: Oncology

## 2013-04-04 ENCOUNTER — Telehealth: Payer: Self-pay | Admitting: Oncology

## 2013-04-04 VITALS — BP 107/65 | HR 85 | Temp 98.1°F | Resp 18 | Ht 62.0 in | Wt 203.7 lb

## 2013-04-04 DIAGNOSIS — B029 Zoster without complications: Secondary | ICD-10-CM

## 2013-04-04 MED ORDER — VALACYCLOVIR HCL 1 G PO TABS: 1000.0000 mg | ORAL_TABLET | Freq: Three times a day (TID) | ORAL | Status: AC

## 2013-04-04 NOTE — Telephone Encounter (Signed)
gv and printed appt and avs for mammo on 6.29.15 @ 10:00am

## 2013-04-04 NOTE — Progress Notes (Signed)
Hematology and Oncology Follow Up Visit  Rhonda Keller [id number] 07-31-1961 52 y.o. 04/04/2013 6:43 PM   Principle Diagnosis: Encounter Diagnosis  Name Primary?  . Herpes zoster infection of thoracic region Yes     Interim History:   Followup visit for this pleasant 52 year old woman with an  initial diagnosis of Ewing's sarcoma, presenting with impending spinal cord compression due to an epidural mass on September 15, 1984, treated with surgery, radiation, and chemotherapy while she was living in [place]. She subsequently developed a stage II, 2.5 cm, ER negative, 1 node positive cancer of the right breast in Feb 13, 1998, treated with lumpectomy, radiation, and chemotherapy with 4 cycles of Taxol followed by 6 cycles of CMF. She developed a second primary 1.1-cm DCIS cancer in the left breast in August 2003. ER/PR negative. Node negative treated with breast radiation.  At time of her visit here last year she complained of dysphagia. I obtained a barium swallow study to rule out radiation stricture. There was no mass or stricture. She did have significant gastroesophageal reflux and a small hiatal hernia.   Over the last few weeks she has noted a pruritic rash approximate T8 dermatome left back which seems to be getting worse.   Medications: reviewed  Allergies:  Allergies  Allergen Reactions  . Penicillins Itching    Review of Systems: Constitutional:   No constitutional symptoms Respiratory: No cough or dyspnea Cardiovascular:  No chest pain or palpitations Gastrointestinal: No change in bowel habit Genito-Urinary: No vaginal bleeding or discharge Musculoskeletal: No muscle bone or joint pain Neurologic: No headache or change in vision Skin: See above Remaining ROS negative.  Physical Exam: Blood pressure 107/65, pulse 85, temperature 98.1 F (36.7 C), temperature source Oral, resp. rate 18, height 5\' 2"  (1.575 m), weight 203 lb 11.2 oz (92.398 kg). Wt Readings from Last  3 Encounters:  04/04/13 203 lb 11.2 oz (92.398 kg)  03/29/12 210 lb (95.255 kg)     General appearance: Well-nourished Caucasian woman HENNT: Pharynx no erythema or exudate Lymph nodes: No lymphadenopathy Breasts: No dominant breast mass Lungs: Clear to auscultation resonant to percussion Heart: Regular rhythm no murmur Abdomen: Soft, nontender, no mass, no organomegaly Extremities: No edema, no calf tenderness Musculoskeletal: No joint deformities GU: Vascular: No cyanosis Neurologic: Motor strength 5 over 5, reflexes absent symmetric at the knees, 1+ symmetric at the biceps, Skin: There is a rash in a dermatomal pattern left back approximate T8 level which at present is erythematous with no blisters.  Lab Results: Lab Results  Component Value Date   WBC 6.3 03/28/2013   HGB 12.0 03/28/2013   HCT 36.6 03/28/2013   MCV 77.1* 03/28/2013   PLT 181 03/28/2013     Chemistry      Component Value Date/Time   NA 143 03/28/2013 1238   NA 140 03/23/2012 1343   K 4.3 03/28/2013 1238   K 4.3 03/23/2012 1343   CL 103 03/23/2012 1343   CO2 30* 03/28/2013 1238   CO2 26 03/23/2012 1343   BUN 22.3 03/28/2013 1238   BUN 16 03/23/2012 1343   CREATININE 0.9 03/28/2013 1238   CREATININE 0.77 03/23/2012 1343      Component Value Date/Time   CALCIUM 10.2 03/28/2013 1238   CALCIUM 9.8 03/23/2012 1343   ALKPHOS 67 03/28/2013 1238   ALKPHOS 64 03/23/2012 1343   AST 22 03/28/2013 1238   AST 17 03/23/2012 1343   ALT 19 03/28/2013 1238   ALT 14 03/23/2012 1343  BILITOT 0.24 03/28/2013 1238   BILITOT 0.3 03/23/2012 1343       Radiological Studies: Mm Digital Screening  03/23/2013   *RADIOLOGY REPORT*  Clinical Data: Screening. History of left lumpectomy in 2003. History of right lumpectomy in 1999.  DIGITAL SCREENING BILATERAL MAMMOGRAM WITH CAD  Comparison:  Previous exams.  FINDINGS:  ACR Breast Density Category b:  There are scattered areas of fibroglandular density.  There are no findings suspicious for malignancy.  Bilateral postoperative changes are identified.  Images were processed with CAD.  IMPRESSION: No mammographic evidence of malignancy.  A result letter of this screening mammogram will be mailed directly to the patient.  RECOMMENDATION: Screening mammogram in one year. (Code:SM-B-01Y)  BI-RADS CATEGORY 1:  Negative.   Original Report Authenticated By: Norva Pavlov, M.D.    Impression: #1. Ewing sarcoma diagnosed at age 64 treated with aggressive surgery radiation and chemotherapy and likely cured.  #2. Stage II, 1 node positive, ER negative, cancer of the right breast treated as outlined above. She is now out 15 years with no evidence for new invasive cancer  #3. DCIS left breast diagnosed August 2003. Now out almost 11 years.  #4. Herpes zoster rash acute I am prescribing Valtrex 1000 mg 3 times daily for the next 7 days. I advised to use a topical lotion such as calamine with Benadryl for symptomatic relief of itching.  #5. GERD  I told her that I thought she was stable enough to graduate from our practice at this time. I would be happy to see her again in the future if the need arises.   CC:.    Levert Feinstein, MD 7/8/20146:43 PM

## 2013-06-08 ENCOUNTER — Telehealth: Payer: Self-pay | Admitting: *Deleted

## 2013-06-08 NOTE — Telephone Encounter (Signed)
Patient called.  She has noticed some vague right shoulder pain over the last few weeks.  She was exercising Monday and felt some pain in the middle of her back and some SOB.  She is still having some mild "SOB" and discomfort in her back.  She is wondering if Dr. Cyndie Chime saw anything in her last visit 04-04-13 that might indicate what this is.  Let her know that per his note, he thought she did not need to return for follow-up (He would be glad to see her if needed).  Let her know that Dr.Granfortuna is not here today, however she does need to give this some attention.  She will call her PCP.  I forwarded the note from the visit on 04-04-13 to her PCP.  She was seeing a PA at that office, who has since retired however, Dr. Lendon Colonel is in that office and sees her parents and she will request to see him.  Asked her let us know what he found and to let us know if we could provide any more information or help with this.

## 2013-06-20 ENCOUNTER — Telehealth: Payer: Self-pay | Admitting: *Deleted

## 2013-06-20 NOTE — Telephone Encounter (Signed)
Called patient to follow up on her phone call from 06/08/13.  She did go to her PCP who wanted to do an MRI and some other tests.  She has not met her deductible yet and she would have to pay almost $2500.  So she elected not to do the tests.  The PCP ordered some tramadol and it is helping some.  She will keep [place] informed.

## 2013-11-06 ENCOUNTER — Ambulatory Visit: Payer: PRIVATE HEALTH INSURANCE | Attending: Unknown Physician Specialty | Admitting: Physical Therapy

## 2013-11-06 DIAGNOSIS — M545 Low back pain, unspecified: Secondary | ICD-10-CM | POA: Insufficient documentation

## 2013-11-06 DIAGNOSIS — IMO0001 Reserved for inherently not codable concepts without codable children: Secondary | ICD-10-CM | POA: Insufficient documentation

## 2013-11-09 ENCOUNTER — Ambulatory Visit: Payer: PRIVATE HEALTH INSURANCE | Admitting: Physical Therapy

## 2013-11-13 ENCOUNTER — Emergency Department (HOSPITAL_BASED_OUTPATIENT_CLINIC_OR_DEPARTMENT_OTHER): Payer: PRIVATE HEALTH INSURANCE

## 2013-11-13 ENCOUNTER — Encounter (HOSPITAL_BASED_OUTPATIENT_CLINIC_OR_DEPARTMENT_OTHER): Payer: Self-pay | Admitting: Emergency Medicine

## 2013-11-13 ENCOUNTER — Emergency Department (HOSPITAL_BASED_OUTPATIENT_CLINIC_OR_DEPARTMENT_OTHER)
Admission: EM | Admit: 2013-11-13 | Discharge: 2013-11-13 | Disposition: A | Discharge status: Home / Self Care | Payer: PRIVATE HEALTH INSURANCE | Attending: Emergency Medicine | Admitting: Emergency Medicine

## 2013-11-13 DIAGNOSIS — S6980XA Other specified injuries of unspecified wrist, hand and finger(s), initial encounter: Secondary | ICD-10-CM | POA: Insufficient documentation

## 2013-11-13 DIAGNOSIS — Y939 Activity, unspecified: Secondary | ICD-10-CM | POA: Insufficient documentation

## 2013-11-13 DIAGNOSIS — Z853 Personal history of malignant neoplasm of breast: Secondary | ICD-10-CM | POA: Insufficient documentation

## 2013-11-13 DIAGNOSIS — Z880 Allergy status to penicillin: Secondary | ICD-10-CM | POA: Insufficient documentation

## 2013-11-13 DIAGNOSIS — IMO0002 Reserved for concepts with insufficient information to code with codable children: Secondary | ICD-10-CM | POA: Insufficient documentation

## 2013-11-13 DIAGNOSIS — Y92009 Unspecified place in unspecified non-institutional (private) residence as the place of occurrence of the external cause: Secondary | ICD-10-CM | POA: Insufficient documentation

## 2013-11-13 DIAGNOSIS — Z23 Encounter for immunization: Secondary | ICD-10-CM | POA: Insufficient documentation

## 2013-11-13 DIAGNOSIS — Z85830 Personal history of malignant neoplasm of bone: Secondary | ICD-10-CM | POA: Insufficient documentation

## 2013-11-13 DIAGNOSIS — S6990XA Unspecified injury of unspecified wrist, hand and finger(s), initial encounter: Secondary | ICD-10-CM | POA: Insufficient documentation

## 2013-11-13 MED ORDER — TETANUS-DIPHTH-ACELL PERTUSSIS 5-2.5-18.5 LF-MCG/0.5 IM SUSP
0.5000 mL | Freq: Once | INTRAMUSCULAR | Status: AC
  Administered 2013-11-13: 0.5 mL via INTRAMUSCULAR
  Filled 2013-11-13: qty 0.5

## 2013-11-13 NOTE — Discharge Instructions (Signed)
Wood Splinters Wood splinters need to be removed because they can cause skin irritation and infection. If they are close to the surface, splinters can usually be removed easily. Deep splinters may be hard to locate and need treatment by a surgeon. SPLINTER REMOVAL Removal of splinters by your caregiver is considered a surgical procedure.   The area is carefully cleaned. You may require a small amount of anaesthesia (medicine injected near the splinter to numb the tissue and lessen pain). After the splinter is removed, the area will be cleaned again. A bandage is applied.  If your splinter is under a fingernail or toenail, then a small section of the nail may need to be removed. As long as the splinter did not extend to the base of the nail, the nail usually grows back normally.  A splinter that is deeper, more contaminated, or that gets near a structure such as a bone, nerve or blood vessel may need to be removed by a Psychologist, sport and exercise.  You may need special X-rays or scans if the splinter is hard to locate.  Every attempt is made to remove the entire splinter. However, small particles may remain. Tell your caregiver if you feel that a part of the splinter was left behind. HOME CARE INSTRUCTIONS   Keep the injured area high up (elevated).  Use the injured area as little as possible.  Keep the injured area clean and dry. Follow any directions from your caregiver.  Keep any follow-up or wound check appointments. You might need a tetanus shot now if:  You have no idea when you had the last one.  You have never had a tetanus shot before.  The injured area had dirt in it. Even if you have already removed the splinter, call your caregiver to get a tetanus shot if you need one.  If you need a tetanus shot, and you decide not to get one, there is a rare chance of getting tetanus. Sickness from tetanus can be serious. If you did get a tetanus shot, your arm may swell, get red and warm to the touch at the  shot site. This is common and not a problem. SEEK MEDICAL CARE IF:   A splinter has been removed, but you are not better in a day or two.  You develop a temperature.  Signs of infection develop such as:  Redness, swelling or pus around the wound.  Red streaks spreading back from your wound towards your body. Document Released: 10/22/2004 Document Revised: 12/07/2011 Document Reviewed: 09/24/2008 [facility] Patient Information 2014 [place], [place].  Warm compresses Monitor area for signs of infection and return if symptoms worsen Ibuprofen for discomfort

## 2013-11-13 NOTE — ED Notes (Signed)
Splinter in her right 2nd digit. Happened after she threw a stick out of her yard.

## 2013-11-13 NOTE — ED Provider Notes (Signed)
CSN: [id number]     Arrival date & time 11/13/13  1200 History   First MD Initiated Contact with Patient 11/13/13 1209     Chief Complaint  Patient presents with  . Foreign Body in Skin     (Consider location/radiation/quality/duration/timing/severity/associated sxs/prior Treatment) Patient is a 53 y.o. female presenting with hand pain. The history is provided by the patient. No language interpreter was used.  Hand Pain This is a new problem. Pertinent negatives include no arthralgias, fever or joint swelling.   Right index finger, history of splinter. She reports that she stuck a twig in her finger a few days ago and now has a sore spot on her finger. She denies any numbness or tingling. She denies any drainage, erythema or edema.   Past Medical History  Diagnosis Date  . 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    Left breast  August 2003 ER/PR negative 1.1 cm  Rx RT  . Ewing's sarcoma of bone 03/29/2012    Dec 1985 presenting with cord compression  Rx surgery, RT, chemo  . Dysphagia, idiopathic 03/29/2012   Past Surgical History  Procedure Laterality Date  . Back surgery    . Breast surgery     No family history on file. History  Substance Use Topics  . Smoking status: Never Smoker   . Smokeless tobacco: Not on file  . Alcohol Use: No   OB History   Grav Para Term Preterm Abortions TAB SAB Ect Mult Living                 Review of Systems  Constitutional: Negative for fever.  Musculoskeletal: Negative for arthralgias and joint swelling.  Skin: Positive for wound.      Allergies  Penicillins  Home Medications  No current outpatient prescriptions on file. BP 132/56  Pulse 80  Temp(Src) 98.3 F (36.8 C) (Oral)  Resp 18  Wt 203 lb (92.08 kg)  SpO2 99% Physical Exam  Nursing note and vitals reviewed. Constitutional: She is oriented to person, place, and time. She appears well-developed and well-nourished. No distress.  HENT:  Head: Normocephalic  and atraumatic.  Eyes: Conjunctivae and EOM are normal.  Neck: Normal range of motion. Neck supple.  Cardiovascular: Normal rate, regular rhythm and normal heart sounds.   Pulmonary/Chest: Effort normal and breath sounds normal.  Musculoskeletal: Normal range of motion.  Neurological: She is alert and oriented to person, place, and time.  Skin: Skin is warm and dry.     Right index finger, distally and inferior to 1st metacarpal joint. Area of skin where she had a splinter and now has some superficial tenderness. No erythema or sign of foreign body. No drainage.   Psychiatric: She has a normal mood and affect. Her behavior is normal. Judgment and thought content normal.    ED Course  Procedures (including critical care time) Labs Review Labs Reviewed - No data to display Imaging Review Dg Finger Index Right  11/13/2013   CLINICAL DATA:  Splinter over distal finger.  EXAM: RIGHT INDEX FINGER 2+V  COMPARISON:  None.  FINDINGS: There is no evidence of fracture or dislocation. There is no evidence of arthropathy or other focal bone abnormality. Soft tissues are unremarkable.  IMPRESSION: Negative.   Electronically Signed   By: Marin Olp M.D.   On: 11/13/2013 12:38    EKG Interpretation   None       MDM   Final diagnoses:  Finger injury  Negative finger x-ray. No signs of foreign body. No signs of infection. Updated Tdap. Discussed warm compresses and monitor for changes in wound. Return precautions given.      Elisha Headland, NP 11/13/13 2122

## 2013-11-14 ENCOUNTER — Ambulatory Visit: Payer: PRIVATE HEALTH INSURANCE | Admitting: Physical Therapy

## 2013-11-14 NOTE — ED Provider Notes (Signed)
Medical screening examination/treatment/procedure(s) were performed by non-physician practitioner and as supervising physician I was immediately available for consultation/collaboration.     Veryl Speak, MD 11/14/13 [phone]

## 2013-11-16 ENCOUNTER — Ambulatory Visit: Payer: PRIVATE HEALTH INSURANCE | Admitting: Physical Therapy

## 2013-11-23 ENCOUNTER — Ambulatory Visit: Payer: PRIVATE HEALTH INSURANCE | Admitting: Physical Therapy

## 2013-11-24 ENCOUNTER — Ambulatory Visit: Payer: PRIVATE HEALTH INSURANCE | Admitting: Physical Therapy

## 2013-11-25 ENCOUNTER — Encounter: Payer: Self-pay | Admitting: Oncology

## 2013-11-28 ENCOUNTER — Ambulatory Visit: Payer: PRIVATE HEALTH INSURANCE | Admitting: Physical Therapy

## 2013-11-30 ENCOUNTER — Ambulatory Visit: Payer: PRIVATE HEALTH INSURANCE | Attending: Unknown Physician Specialty | Admitting: Physical Therapy

## 2013-11-30 DIAGNOSIS — IMO0001 Reserved for inherently not codable concepts without codable children: Secondary | ICD-10-CM | POA: Insufficient documentation

## 2013-11-30 DIAGNOSIS — M545 Low back pain, unspecified: Secondary | ICD-10-CM | POA: Insufficient documentation

## 2014-03-26 ENCOUNTER — Other Ambulatory Visit: Payer: Self-pay | Admitting: Oncology

## 2014-03-26 ENCOUNTER — Ambulatory Visit
Admission: RE | Admit: 2014-03-26 | Discharge: 2014-03-26 | Disposition: A | Discharge status: Home / Self Care | Payer: PRIVATE HEALTH INSURANCE | Source: Ambulatory Visit | Attending: Oncology | Admitting: Oncology

## 2014-03-26 ENCOUNTER — Ambulatory Visit
Admission: RE | Admit: 2014-03-26 | Discharge: 2014-03-26 | Disposition: A | Discharge status: Home / Self Care | Payer: 59 | Source: Ambulatory Visit | Attending: Oncology | Admitting: Oncology

## 2014-03-26 DIAGNOSIS — Z1231 Encounter for screening mammogram for malignant neoplasm of breast: Secondary | ICD-10-CM

## 2014-03-26 DIAGNOSIS — B029 Zoster without complications: Secondary | ICD-10-CM

## 2014-03-28 ENCOUNTER — Telehealth: Payer: Self-pay | Admitting: *Deleted

## 2014-03-28 NOTE — Telephone Encounter (Signed)
Pt called /informed mammogram negative.  And she did state she does have an OB/GYN doctor - asked her to have this doctor to continue to order/monitor her mammograms; and she stated she would.

## 2014-03-28 NOTE — Telephone Encounter (Signed)
Message copied by Ebbie Latus on Wed Mar 28, 2014  4:36 PM ------      Message from: Annia Belt      Created: Tue Mar 27, 2014  8:07 AM       Call pt: mammograms negative.  Does she have a primary care or OB/Gyn that will continue to order mammograms? ------

## 2015-03-28 ENCOUNTER — Other Ambulatory Visit: Payer: Self-pay

## 2015-03-28 DIAGNOSIS — Z1231 Encounter for screening mammogram for malignant neoplasm of breast: Secondary | ICD-10-CM

## 2015-04-02 ENCOUNTER — Ambulatory Visit
Admission: RE | Admit: 2015-04-02 | Discharge: 2015-04-02 | Disposition: A | Discharge status: Home / Self Care | Payer: 59 | Source: Ambulatory Visit

## 2015-04-02 DIAGNOSIS — Z1231 Encounter for screening mammogram for malignant neoplasm of breast: Secondary | ICD-10-CM

## 2016-03-10 ENCOUNTER — Other Ambulatory Visit: Payer: Self-pay | Admitting: Obstetrics and Gynecology

## 2016-03-10 DIAGNOSIS — Z1231 Encounter for screening mammogram for malignant neoplasm of breast: Secondary | ICD-10-CM

## 2016-03-10 DIAGNOSIS — Z853 Personal history of malignant neoplasm of breast: Secondary | ICD-10-CM

## 2016-04-02 ENCOUNTER — Ambulatory Visit
Admission: RE | Admit: 2016-04-02 | Discharge: 2016-04-02 | Disposition: A | Discharge status: Home / Self Care | Payer: 59 | Source: Ambulatory Visit | Attending: Obstetrics and Gynecology | Admitting: Obstetrics and Gynecology

## 2016-04-02 DIAGNOSIS — Z1231 Encounter for screening mammogram for malignant neoplasm of breast: Secondary | ICD-10-CM

## 2016-04-02 DIAGNOSIS — Z853 Personal history of malignant neoplasm of breast: Secondary | ICD-10-CM

## 2016-04-03 ENCOUNTER — Other Ambulatory Visit: Payer: Self-pay | Admitting: Obstetrics and Gynecology

## 2016-04-03 DIAGNOSIS — R928 Other abnormal and inconclusive findings on diagnostic imaging of breast: Secondary | ICD-10-CM

## 2016-04-09 ENCOUNTER — Ambulatory Visit
Admission: RE | Admit: 2016-04-09 | Discharge: 2016-04-09 | Disposition: A | Discharge status: Home / Self Care | Payer: 59 | Source: Ambulatory Visit | Attending: Obstetrics and Gynecology | Admitting: Obstetrics and Gynecology

## 2016-04-09 DIAGNOSIS — R928 Other abnormal and inconclusive findings on diagnostic imaging of breast: Secondary | ICD-10-CM

## 2016-12-04 ENCOUNTER — Other Ambulatory Visit: Payer: Self-pay | Admitting: Obstetrics and Gynecology

## 2016-12-04 DIAGNOSIS — Z1231 Encounter for screening mammogram for malignant neoplasm of breast: Secondary | ICD-10-CM

## 2017-04-06 ENCOUNTER — Ambulatory Visit
Admission: RE | Admit: 2017-04-06 | Discharge: 2017-04-06 | Disposition: A | Discharge status: Home / Self Care | Payer: 59 | Source: Ambulatory Visit | Attending: Obstetrics and Gynecology | Admitting: Obstetrics and Gynecology

## 2017-04-06 DIAGNOSIS — Z1231 Encounter for screening mammogram for malignant neoplasm of breast: Secondary | ICD-10-CM

## 2017-04-06 HISTORY — DX: Malignant neoplasm of unspecified site of unspecified female breast: C50.919

## 2017-04-06 HISTORY — DX: Personal history of antineoplastic chemotherapy: Z92.21

## 2017-04-06 HISTORY — DX: Personal history of irradiation: Z92.3

## 2018-02-15 LAB — RESULTS CONSOLE HPV: CHL HPV: NEGATIVE

## 2018-02-15 LAB — HM PAP SMEAR

## 2018-03-03 ENCOUNTER — Other Ambulatory Visit: Payer: Self-pay | Admitting: Obstetrics and Gynecology

## 2018-03-03 DIAGNOSIS — Z1231 Encounter for screening mammogram for malignant neoplasm of breast: Secondary | ICD-10-CM

## 2018-04-07 ENCOUNTER — Ambulatory Visit
Admission: RE | Admit: 2018-04-07 | Discharge: 2018-04-07 | Disposition: A | Discharge status: Home / Self Care | Payer: 59 | Source: Ambulatory Visit | Attending: Obstetrics and Gynecology | Admitting: Obstetrics and Gynecology

## 2018-04-07 DIAGNOSIS — Z1231 Encounter for screening mammogram for malignant neoplasm of breast: Secondary | ICD-10-CM

## 2019-01-20 LAB — HM COLONOSCOPY

## 2019-09-26 ENCOUNTER — Other Ambulatory Visit: Payer: Self-pay | Admitting: Neurological Surgery

## 2019-09-26 DIAGNOSIS — M5412 Radiculopathy, cervical region: Secondary | ICD-10-CM

## 2019-11-01 ENCOUNTER — Other Ambulatory Visit: Payer: Self-pay

## 2019-11-01 ENCOUNTER — Ambulatory Visit
Admission: RE | Admit: 2019-11-01 | Discharge: 2019-11-01 | Disposition: A | Discharge status: Home / Self Care | Payer: 59 | Source: Ambulatory Visit | Attending: Neurological Surgery | Admitting: Neurological Surgery

## 2019-11-01 DIAGNOSIS — M5412 Radiculopathy, cervical region: Secondary | ICD-10-CM

## 2019-11-27 ENCOUNTER — Other Ambulatory Visit: Payer: Self-pay | Admitting: Obstetrics and Gynecology

## 2019-11-27 DIAGNOSIS — R928 Other abnormal and inconclusive findings on diagnostic imaging of breast: Secondary | ICD-10-CM

## 2019-11-29 ENCOUNTER — Ambulatory Visit
Admission: RE | Admit: 2019-11-29 | Discharge: 2019-11-29 | Disposition: A | Discharge status: Home / Self Care | Payer: 59 | Source: Ambulatory Visit | Attending: Obstetrics and Gynecology | Admitting: Obstetrics and Gynecology

## 2019-11-29 ENCOUNTER — Other Ambulatory Visit: Payer: Self-pay | Admitting: Obstetrics and Gynecology

## 2019-11-29 ENCOUNTER — Other Ambulatory Visit: Payer: Self-pay

## 2019-11-29 DIAGNOSIS — R928 Other abnormal and inconclusive findings on diagnostic imaging of breast: Secondary | ICD-10-CM

## 2019-11-29 LAB — HM MAMMOGRAPHY

## 2019-11-30 ENCOUNTER — Ambulatory Visit
Admission: RE | Admit: 2019-11-30 | Discharge: 2019-11-30 | Disposition: A | Discharge status: Home / Self Care | Payer: 59 | Source: Ambulatory Visit | Attending: Obstetrics and Gynecology | Admitting: Obstetrics and Gynecology

## 2019-11-30 ENCOUNTER — Other Ambulatory Visit: Payer: Self-pay

## 2019-11-30 DIAGNOSIS — R928 Other abnormal and inconclusive findings on diagnostic imaging of breast: Secondary | ICD-10-CM

## 2019-12-01 ENCOUNTER — Encounter: Payer: Self-pay | Admitting: *Deleted

## 2019-12-04 ENCOUNTER — Other Ambulatory Visit: Payer: Self-pay | Admitting: *Deleted

## 2019-12-04 DIAGNOSIS — C50311 Malignant neoplasm of lower-inner quadrant of right female breast: Secondary | ICD-10-CM | POA: Insufficient documentation

## 2019-12-04 DIAGNOSIS — Z170 Estrogen receptor positive status [ER+]: Secondary | ICD-10-CM | POA: Insufficient documentation

## 2019-12-05 NOTE — Progress Notes (Signed)
[facility]  Telephone:([phone]) [phone] Fax:(336) [phone]     ID: Rhonda Keller DOB: 20-Oct-1960  MR#: [id number]  JFH#:[id number]  Patient Care Team: Patient, No Pcp Per as PCP - General (General Practice) Mauro Kaufmann, RN as Oncology Nurse Navigator Rockwell Germany, RN as Oncology Nurse Navigator Erroll Luna, MD as Consulting Physician (General Surgery) Porfirio Bollier, Virgie Dad, MD as Consulting Physician (Oncology) Kyung Rudd, MD as Consulting Physician (Radiation Oncology) Kristeen Miss, MD as Consulting Physician (Neurosurgery) Bobbye Charleston, MD as Consulting Physician (Obstetrics and Gynecology) Aurea Graff OTHER MD: Garlon Hatchet, MD [phone] opthamology)  CHIEF COMPLAINT:   CURRENT TREATMENT:    HISTORY OF CURRENT ILLNESS: RAE Keller has a history of bilateral breast cancers. Right breast cancer was diagnosed in 01/1998, invasive ductal carcinoma, node positive, reportedly estrogen and progesterone receptor negative and treated with lumpectomy, adjuvant radiation therapy, and 4 cycles of Taxol (Taxotere?) followed by 6 cycles of CMF. 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  The patient recalls taking tamoxifen for some time.  She also has a history of Ewing's carcinoma, which was diagnosed in 17 (age 30) and treated with surgery, radiation therapy, and chemotherapy in [place].  More recently she had routine screening mammography on 11/24/2019 showing a possible abnormality in the right breast. She underwent right diagnostic mammography with tomography and right breast ultrasonography at [facility] on 11/29/2019 showing: breast density category B; there was a 1.2 cm asymmetry in the lower-inner right breast without sonographic correlate; normal-appearing right axilla lymph nodes.  Accordingly on 11/30/2019 she proceeded to biopsy of the right breast area in question. The  pathology from this procedure ([id number]) showed: invasive and in situ mammary carcinoma, grade 2, e-cadherin positive. Prognostic indicators significant for: estrogen receptor, >95% positive and progesterone receptor, >95% positive, both with strong staining intensity. Proliferation marker Ki67 at 10%. HER2 equivocal by immunohistochemistry (2+), but negative by fluorescent in situ hybridization with a signals ratio 1.46 and number per cell 1.90.  The patient's subsequent history is as detailed below.   INTERVAL HISTORY: Rhonda Keller was evaluated in the multidisciplinary breast cancer clinic on 12/06/2019. Her case was also presented at the multidisciplinary breast cancer conference on the same day. At that time a preliminary plan was proposed: Mastectomy or consideration of bilateral mastectomy, with no sentinel lymph node sampling think she must have had axillary lymph node dissection previously, likely no further radiation since she has already had radiation, but yes antiestrogens, and genetics   REVIEW OF SYSTEMS: Leighann reports eye pain from recent cataract surgery to both eyes. 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 On the provided questionnaire, she reports feet swelling, shortness of breath with walking and climbing stairs, and dribbling urine and incontinence. There were no specific symptoms leading to the original mammogram, which was routinely scheduled. The patient denies unusual headaches, visual changes, nausea, vomiting, stiff neck, dizziness, or gait imbalance. There has been no cough, phlegm production, or pleurisy, no chest pain or pressure, and no change in bowel or bladder habits. A detailed review of systems was otherwise entirely negative.   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   Left breast  August 2003 ER/PR negative  1.1 cm  Rx RT   Dysphagia, idiopathic 03/29/2012   Ewing's  sarcoma of bone [facility]) 03/29/2012   Dec 1985 presenting with cord compression  Rx surgery, RT, chemo   Personal history of chemotherapy    Personal history of radiation therapy     PAST SURGICAL HISTORY: Past Surgical History:  Procedure Laterality Date   BACK SURGERY     BREAST BIOPSY     BREAST LUMPECTOMY     bilateral    BREAST SURGERY      FAMILY HISTORY: Family History  Problem Relation Age of Onset   Breast cancer Mother    Patient's father is currently 83 years old and her mother is 66 years old (as of 12/05/19). On her father's side, there is no cancer to her knowoledge.  The patient's mother had breast cancer but Rhonda Keller is not sure at what age.  The patient has 1 brother and 0 sisters. There is no ovarian, pancreatic, or prostate cancer in the family to the patient's knowledge.   GYNECOLOGIC HISTORY:  No LMP recorded. Patient is postmenopausal. Menarche: 6-110 years old [place] P 0 LMP "years ago," she thinks early 22's Contraceptive used in her 70's without issue. HRT never used  Hysterectomy? no BSO? 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 She is single. She lives at home alone with her two dogs, a ShiTzu and a Morphy.     ADVANCED DIRECTIVES: In place-- She has named Fredrik Rigger [phone]) as her HCPOA   HEALTH MAINTENANCE: Social History   Tobacco Use   Smoking status: Never Smoker   Smokeless tobacco: Never Used  Substance Use Topics   Alcohol use: Yes    Comment: every other week   Drug use: No     Colonoscopy: 2018  PAP: 2019  Bone density: date unknown   Allergies  Allergen Reactions   Penicillins Itching    Current Outpatient Medications  Medication Sig Dispense Refill   omeprazole (PRILOSEC) 20 MG capsule Take by mouth.     No current facility-administered medications for this visit.    OBJECTIVE: Morbidly obese  white woman who appears younger than stated age  66:   12/06/19 1247  BP: 139/78  Pulse: 74  Resp: 18  Temp: 98.2 F (36.8 C)  SpO2: 100%     Body mass index is 41.12 kg/m.   Wt Readings from Last 3 Encounters:  12/06/19 224 lb 12.8 oz (102 kg)  11/13/13 203 lb (92.1 kg)  04/04/13 203 lb 11.2 oz (92.4 kg)      ECOG FS:2 - Symptomatic, <50% confined to bed  Ocular: Sclerae unicteric, pupils round and equal Ear-nose-throat: Wearing a mask Lymphatic: No cervical or supraclavicular adenopathy Lungs no rales or rhonchi Heart regular rate and rhythm Abd soft, nontender, positive bowel sounds MSK no focal spinal tenderness, no joint edema Neuro: Significant neuropathy both lower extremities with limited ambulation; otherwise non-focal, well-oriented, positive affect Breasts: Status post bilateral lumpectomies and bilateral radiation.  The right breast is status post recent biopsy.  There are no palpable masses.  Both axillae are benign.   LAB RESULTS:  CMP     Component Value Date/Time   NA 140 12/06/2019 1208   NA 143 03/28/2013 1238   K 4.2 12/06/2019 1208   K 4.3 03/28/2013 1238   CL 102 12/06/2019 1208   CO2 29 12/06/2019 1208   CO2 30 (H) 03/28/2013 1238   GLUCOSE 112 (H) 12/06/2019 1208   GLUCOSE 104 03/28/2013 1238   BUN 14  12/06/2019 1208   BUN 22.3 03/28/2013 1238   CREATININE 0.79 12/06/2019 1208   CREATININE 0.9 03/28/2013 1238   CALCIUM 9.5 12/06/2019 1208   CALCIUM 10.2 03/28/2013 1238   PROT 7.1 12/06/2019 1208   PROT 7.5 03/28/2013 1238   ALBUMIN 3.5 12/06/2019 1208   ALBUMIN 3.6 03/28/2013 1238   AST 14 (L) 12/06/2019 1208   AST 22 03/28/2013 1238   ALT 15 12/06/2019 1208   ALT 19 03/28/2013 1238   ALKPHOS 83 12/06/2019 1208   ALKPHOS 67 03/28/2013 1238   BILITOT 0.3 12/06/2019 1208   BILITOT 0.24 03/28/2013 1238   GFRNONAA >60 12/06/2019 1208   GFRAA >60 12/06/2019 1208    No results found for: TOTALPROTELP, ALBUMINELP, A1GS, A2GS,  BETS, BETA2SER, GAMS, MSPIKE, SPEI  Lab Results  Component Value Date   WBC 7.0 12/06/2019   NEUTROABS 4.9 12/06/2019   HGB 11.5 (L) 12/06/2019   HCT 38.5 12/06/2019   MCV 80.2 12/06/2019   PLT 215 12/06/2019    No results found for: LABCA2  No components found for: QZRAQT622  No results for input(s): INR in the last 168 hours.  No results found for: LABCA2  No results found for: QJF354  No results found for: TGY563  No results found for: SLH734  No results found for: CA2729  No components found for: HGQUANT  No results found for: CEA1 / No results found for: CEA1   No results found for: AFPTUMOR  No results found for: CHROMOGRNA  No results found for: KPAFRELGTCHN, LAMBDASER, KAPLAMBRATIO (kappa/lambda light chains)  No results found for: HGBA, HGBA2QUANT, HGBFQUANT, HGBSQUAN (Hemoglobinopathy evaluation)   Lab Results  Component Value Date   LDH 160 03/23/2012    No results found for: IRON, TIBC, IRONPCTSAT (Iron and TIBC)  No results found for: FERRITIN  Urinalysis No results found for: COLORURINE, APPEARANCEUR, LABSPEC, PHURINE, GLUCOSEU, HGBUR, BILIRUBINUR, KETONESUR, PROTEINUR, UROBILINOGEN, NITRITE, LEUKOCYTESUR   STUDIES: US BREAST LTD UNI RIGHT INC AXILLA  Addendum Date: 11/30/2019   ADDENDUM REPORT: 11/30/2019 09:08 ADDENDUM: EXAM: DIGITAL DIAGNOSTIC RIGHT MAMMOGRAM WITH TOMO; ULTRASOUND RIGHT BREAST Electronically Signed   By: Audie Pinto M.D.   On: 11/30/2019 09:08   Result Date: 11/30/2019 CLINICAL DATA:  59 year old female presenting as a recall from screening for possible right breast asymmetry. History of bilateral breast cancers status post lumpectomy and radiation. EXAM: DIGITAL DIAGNOSTIC BILATERAL MAMMOGRAM WITH TOMO ULTRASOUND RIGHT BREAST COMPARISON:  Previous exam(s). ACR Breast Density Category b: There are scattered areas of fibroglandular density. FINDINGS: Mammogram: Additional spot compression tomosynthesis views performed  for the questioned asymmetry in the lower inner right breast. On the additional imaging there is persistence of a vague asymmetry measuring approximately 1.2 cm in the posterior aspect of the breast. There are no additional findings. Ultrasound: Targeted ultrasound is performed throughout the lower inner quadrant of the right breast demonstrating no suspicious cystic or solid mass or other finding to correspond to the asymmetry seen mammographically. Targeted ultrasound of the right axilla demonstrates normal-appearing lymph nodes. IMPRESSION: Right breast asymmetry measuring approximately 1.2 cm without sonographic correlate is indeterminate. Tissue sampling is recommended. RECOMMENDATION: Stereotactic core needle biopsy of the right breast. This will be scheduled at the patient's earliest convenience. BI-RADS CATEGORY  4: Suspicious. Electronically Signed: By: Audie Pinto M.D. On: 11/29/2019 14:50   MM DIAG BREAST TOMO UNI RIGHT  Addendum Date: 11/30/2019   ADDENDUM REPORT: 11/30/2019 09:08 ADDENDUM: EXAM: DIGITAL DIAGNOSTIC RIGHT MAMMOGRAM WITH TOMO; ULTRASOUND RIGHT BREAST Electronically Signed  By: Audie Pinto M.D.   On: 11/30/2019 09:08   Result Date: 11/30/2019 CLINICAL DATA:  59 year old female presenting as a recall from screening for possible right breast asymmetry. History of bilateral breast cancers status post lumpectomy and radiation. EXAM: DIGITAL DIAGNOSTIC BILATERAL MAMMOGRAM WITH TOMO ULTRASOUND RIGHT BREAST COMPARISON:  Previous exam(s). ACR Breast Density Category b: There are scattered areas of fibroglandular density. FINDINGS: Mammogram: Additional spot compression tomosynthesis views performed for the questioned asymmetry in the lower inner right breast. On the additional imaging there is persistence of a vague asymmetry measuring approximately 1.2 cm in the posterior aspect of the breast. There are no additional findings. Ultrasound: Targeted ultrasound is performed  throughout the lower inner quadrant of the right breast demonstrating no suspicious cystic or solid mass or other finding to correspond to the asymmetry seen mammographically. Targeted ultrasound of the right axilla demonstrates normal-appearing lymph nodes. IMPRESSION: Right breast asymmetry measuring approximately 1.2 cm without sonographic correlate is indeterminate. Tissue sampling is recommended. RECOMMENDATION: Stereotactic core needle biopsy of the right breast. This will be scheduled at the patient's earliest convenience. BI-RADS CATEGORY  4: Suspicious. Electronically Signed: By: Audie Pinto M.D. On: 11/29/2019 14:50   MM CLIP PLACEMENT RIGHT  Result Date: 11/30/2019 CLINICAL DATA:  Post biopsy mammogram of right breast for clip placement. EXAM: DIAGNOSTIC RIGHT MAMMOGRAM POST STEREOTACTIC BIOPSY COMPARISON:  Previous exam(s). FINDINGS: Mammographic images were obtained following stereotactic guided biopsy of an asymmetry in the lower-inner right breast. The biopsy marking clip is approximately 1.5 cm medial to the biopsy target. IMPRESSION: The coil shaped biopsy marking clip is approximately 1.5 cm medial to the biopsy target. Final Assessment: Post Procedure Mammograms for Marker Placement Electronically Signed   By: Ammie Ferrier M.D.   On: 11/30/2019 08:36   MM RT BREAST BX W LOC DEV 1ST LESION IMAGE BX SPEC STEREO GUIDE  Addendum Date: 12/01/2019   ADDENDUM REPORT: 12/01/2019 11:13 ADDENDUM: Pathology revealed GRADE II INVASIVE MAMMARY CARCINOMA, MAMMARY CARCINOMA IN-SITU of the RIGHT breast, lower inner. Note that coil clip is 1.5 cm medial to the asymmetry. This was found to be concordant by Dr. Ammie Ferrier. Pathology results were discussed with the patient by telephone. The patient reported doing well after the biopsy with tenderness at the site. Post biopsy instructions and care were reviewed and questions were answered. The patient was encouraged to call [facility] for any additional concerns. The patient was referred to [facility] on December 06, 2019. Pathology results reported by Stacie Acres RN on 12/01/2019. Electronically Signed   By: Ammie Ferrier M.D.   On: 12/01/2019 11:13   Result Date: 12/01/2019 CLINICAL DATA:  59 year old female presenting for stereotactic biopsy of a right breast asymmetry. EXAM: RIGHT BREAST STEREOTACTIC CORE NEEDLE BIOPSY COMPARISON:  Previous exams. FINDINGS: The patient and I discussed the procedure of stereotactic-guided biopsy including benefits and alternatives. We discussed the high likelihood of a successful procedure. We discussed the risks of the procedure including infection, bleeding, tissue injury, clip migration, and inadequate sampling. Informed written consent was given. The usual time out protocol was performed immediately prior to the procedure. Using sterile technique and 1% Lidocaine as local anesthetic, under stereotactic guidance, a 9 gauge vacuum assisted device was used to perform core needle biopsy of an asymmetry in the medial posterior right breast using a medial approach. Lesion quadrant: Lower-inner quadrant At the conclusion of the procedure, coil  shaped tissue marker clip was deployed into the biopsy cavity. Follow-up 2-view mammogram was performed and dictated separately. IMPRESSION: Stereotactic-guided biopsy of an asymmetry in the lower-inner. No apparent complications. Electronically Signed: By: Ammie Ferrier M.D. On: 11/30/2019 08:33     ELIGIBLE FOR AVAILABLE RESEARCH PROTOCOL: AET  ASSESSMENT: 59 y.o. [place] woman status post right breast lower inner quadrant biopsy 11/30/2019 for a clinical T1c N0, stage IA invasive ductal carcinoma, grade 2, estrogen and progesterone receptor positive, with an MIB-1 of 10% and no HER-2 amplification.  (1) note in the history of present illness that the patient has  had bilateral lumpectomies and bilateral breast irradiation in the past as well as a history of chemotherapy for Ewing's sarcoma and for breast cancer.  (2) genetics testing  (3) definitive surgery pending  (4) antiestrogens: note she took tamoxifen for uncertain period of time in the past  PLAN: I met today with Alaycia to review her new diagnosis. Specifically we discussed the biology of her breast cancer, its diagnosis, staging, treatment  options and prognosis.  Her situation is complex and if she did not have her prior history we would likely obtain an Oncotype and that would likely be low risk given that this is not a grade 3 tumor and that it is very slow-growing.  However she has had chemotherapy twice in the past, for breast cancer and for Ewing sarcoma and it would be difficult to justify further chemotherapy given that history even if she had an unfavorable Oncotype: Not only does she have significant peripheral neuropathy, but she also probably received an unknown amount of anthracycline for her Ewing sarcoma.  In short chemotherapy is not appropriate in this setting for this stage of disease.  She will benefit from surgery and understands that the standard of care for local recurrence in the previously irradiated breast is mastectomy.  She is interested in bilateral mastectomies and I support that decision given that this will be her third episode of breast cancer.  She has already had radiation to both breasts so no further radiation is planned.  Her systemic therapy will consist of antiestrogens.  Likely this will be anastrozole and if she tolerates it well I will proceed to bone density determination.    She will see me again in approximately a month.  We should have the final results of her surgery by then.  She knows to call for any other issue that may develop before the next visit.  Total encounter time 60 minutes.Saudia Smyser has a good understanding of the overall plan.  She agrees with it. She knows the goal of treatment in her case is cure. She will call with any problems that may develop before her next visit here.   Virgie Dad. Willman Cuny, MD 12/06/2019 2:01 PM Medical Oncology and Hematology [facility] [place], [place] [address] Tel. [phone]    Fax. [phone]   This document serves as a record of services personally performed by Lurline Del, MD. It was created on his behalf by Wilburn Mylar, a trained medical scribe. The creation of this record is based on the scribe's personal observations and the provider's statements to them.   I, Lurline Del MD, have reviewed the above documentation for accuracy and completeness, and I agree with the above.    *Total Encounter Time as defined by the Centers for Medicare and Medicaid Services includes, in addition to the face-to-face time of a patient visit (documented  in the note above) non-face-to-face time: obtaining and reviewing outside history, ordering and reviewing medications, tests or procedures, care coordination (communications with other health care professionals or caregivers) and documentation in the medical record.

## 2019-12-06 ENCOUNTER — Inpatient Hospital Stay (HOSPITAL_BASED_OUTPATIENT_CLINIC_OR_DEPARTMENT_OTHER): Payer: 59 | Admitting: Oncology

## 2019-12-06 ENCOUNTER — Encounter: Payer: Self-pay | Admitting: Oncology

## 2019-12-06 ENCOUNTER — Inpatient Hospital Stay: Payer: 59 | Attending: Oncology

## 2019-12-06 ENCOUNTER — Ambulatory Visit: Payer: Self-pay | Admitting: Surgery

## 2019-12-06 ENCOUNTER — Encounter: Payer: Self-pay | Admitting: Licensed Clinical Social Worker

## 2019-12-06 ENCOUNTER — Encounter: Payer: Self-pay | Admitting: Physical Therapy

## 2019-12-06 ENCOUNTER — Ambulatory Visit
Admission: RE | Admit: 2019-12-06 | Discharge: 2019-12-06 | Disposition: A | Discharge status: Home / Self Care | Payer: 59 | Source: Ambulatory Visit | Attending: Radiation Oncology | Admitting: Radiation Oncology

## 2019-12-06 ENCOUNTER — Other Ambulatory Visit: Payer: Self-pay

## 2019-12-06 ENCOUNTER — Ambulatory Visit: Payer: 59 | Attending: Surgery | Admitting: Physical Therapy

## 2019-12-06 VITALS — BP 139/78 | HR 74 | Temp 98.2°F | Resp 18 | Ht 62.0 in | Wt 224.8 lb

## 2019-12-06 DIAGNOSIS — Z6841 Body Mass Index (BMI) 40.0 and over, adult: Secondary | ICD-10-CM

## 2019-12-06 DIAGNOSIS — C50311 Malignant neoplasm of lower-inner quadrant of right female breast: Secondary | ICD-10-CM | POA: Diagnosis not present

## 2019-12-06 DIAGNOSIS — Z923 Personal history of irradiation: Secondary | ICD-10-CM | POA: Insufficient documentation

## 2019-12-06 DIAGNOSIS — T451X5A Adverse effect of antineoplastic and immunosuppressive drugs, initial encounter: Secondary | ICD-10-CM

## 2019-12-06 DIAGNOSIS — Z9221 Personal history of antineoplastic chemotherapy: Secondary | ICD-10-CM | POA: Diagnosis not present

## 2019-12-06 DIAGNOSIS — Z170 Estrogen receptor positive status [ER+]: Secondary | ICD-10-CM | POA: Insufficient documentation

## 2019-12-06 DIAGNOSIS — G620 Drug-induced polyneuropathy: Secondary | ICD-10-CM | POA: Diagnosis not present

## 2019-12-06 DIAGNOSIS — Z803 Family history of malignant neoplasm of breast: Secondary | ICD-10-CM | POA: Diagnosis not present

## 2019-12-06 DIAGNOSIS — Z79811 Long term (current) use of aromatase inhibitors: Secondary | ICD-10-CM | POA: Insufficient documentation

## 2019-12-06 DIAGNOSIS — C50911 Malignant neoplasm of unspecified site of right female breast: Secondary | ICD-10-CM

## 2019-12-06 DIAGNOSIS — R293 Abnormal posture: Secondary | ICD-10-CM | POA: Insufficient documentation

## 2019-12-06 DIAGNOSIS — Z9013 Acquired absence of bilateral breasts and nipples: Secondary | ICD-10-CM

## 2019-12-06 DIAGNOSIS — Z880 Allergy status to penicillin: Secondary | ICD-10-CM | POA: Insufficient documentation

## 2019-12-06 DIAGNOSIS — Z79899 Other long term (current) drug therapy: Secondary | ICD-10-CM | POA: Insufficient documentation

## 2019-12-06 DIAGNOSIS — D0510 Intraductal carcinoma in situ of unspecified breast: Secondary | ICD-10-CM

## 2019-12-06 DIAGNOSIS — C50912 Malignant neoplasm of unspecified site of left female breast: Secondary | ICD-10-CM | POA: Insufficient documentation

## 2019-12-06 DIAGNOSIS — C419 Malignant neoplasm of bone and articular cartilage, unspecified: Secondary | ICD-10-CM

## 2019-12-06 DIAGNOSIS — Z853 Personal history of malignant neoplasm of breast: Secondary | ICD-10-CM | POA: Diagnosis not present

## 2019-12-06 DIAGNOSIS — C50919 Malignant neoplasm of unspecified site of unspecified female breast: Secondary | ICD-10-CM

## 2019-12-06 LAB — CMP (CANCER CENTER ONLY)
ALT: 15 U/L (ref 0–44)
AST: 14 U/L — ABNORMAL LOW (ref 15–41)
Albumin: 3.5 g/dL (ref 3.5–5.0)
Alkaline Phosphatase: 83 U/L (ref 38–126)
Anion gap: 9 (ref 5–15)
BUN: 14 mg/dL (ref 6–20)
CO2: 29 mmol/L (ref 22–32)
Calcium: 9.5 mg/dL (ref 8.9–10.3)
Chloride: 102 mmol/L (ref 98–111)
Creatinine: 0.79 mg/dL (ref 0.44–1.00)
GFR, Est AFR Am: >60 mL/min (ref >60)
GFR, Estimated: >60 mL/min (ref >60)
Glucose, Bld: 112 mg/dL — ABNORMAL HIGH (ref 70–99)
Potassium: 4.2 mmol/L (ref 3.5–5.1)
Sodium: 140 mmol/L (ref 135–145)
Total Bilirubin: 0.3 mg/dL (ref 0.3–1.2)
Total Protein: 7.1 g/dL (ref 6.5–8.1)

## 2019-12-06 LAB — CBC WITH DIFFERENTIAL (CANCER CENTER ONLY)
Abs Immature Granulocytes: 0.02 10*3/uL (ref 0.00–0.07)
Basophils Absolute: 0 10*3/uL (ref 0.0–0.1)
Basophils Relative: 0 %
Eosinophils Absolute: 0.1 10*3/uL (ref 0.0–0.5)
Eosinophils Relative: 2 %
HCT: 38.5 % (ref 36.0–46.0)
Hemoglobin: 11.5 g/dL — ABNORMAL LOW (ref 12.0–15.0)
Immature Granulocytes: 0 %
Lymphocytes Relative: 20 %
Lymphs Abs: 1.4 10*3/uL (ref 0.7–4.0)
MCH: 24 pg — ABNORMAL LOW (ref 26.0–34.0)
MCHC: 29.9 g/dL — ABNORMAL LOW (ref 30.0–36.0)
MCV: 80.2 fL (ref 80.0–100.0)
Monocytes Absolute: 0.5 10*3/uL (ref 0.1–1.0)
Monocytes Relative: 7 %
Neutro Abs: 4.9 10*3/uL (ref 1.7–7.7)
Neutrophils Relative %: 71 %
Platelet Count: 215 10*3/uL (ref 150–400)
RBC: 4.8 MIL/uL (ref 3.87–5.11)
RDW: 14.5 % (ref 11.5–15.5)
WBC Count: 7 10*3/uL (ref 4.0–10.5)
nRBC: 0 % (ref 0.0–0.2)

## 2019-12-06 LAB — GENETIC SCREENING ORDER

## 2019-12-06 NOTE — H&P (Signed)
Rhonda Keller Documented: 12/06/2019 7:28 AM Location: [facility] Patient #: [id number] DOB: 05-02-1961 Undefined / Language: Cleophus Molt / Race: White Female  History of Present Illness Rhonda Moores A. Campbell Agramonte MD; 12/06/2019 2:41 PM) Patient words: Pt presents at the request of the BCG for right breast mammographic abnormality detected on recent mammogram. Hx of left breast cancer 1999 treated with radiation, lumpectomy chemo and SLN mapping ? ALND. 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 Uses a walker to ambulate. No hx of pain mass or discharge.        Patient: Rhonda, Keller Collected: 11/30/2019 Client: [facility] Accession: [id number] Received: 11/30/2019 Ammie Ferrier, MD DOB: 22-Jul-1961 Age: 12 Gender: F Reported: 12/01/2019 [place] Patient Ph: [phone] MRN #: [id number] [place], [place] [address] Client Acc#: Chart #: [id number] Phone: [phone] Fax: CC: [facility] CC CC: Bobbye Charleston MD REPORT OF SURGICAL PATHOLOGY ADDITIONAL INFORMATION: FLUORESCENCE IN-SITU HYBRIDIZATION Results: GROUP 5: HER2 **NEGATIVE** Equivocal form of amplification of the HER2 gene was detected in the IHC 2+ tissue sample received from this individual. HER2 FISH was performed by a technologist and cell imaging and analysis on the [employer]View. RATIO OF HER2/CEN17 SIGNALS 1.46 AVERAGE HER2 COPY NUMBER PER CELL 1.90 The ratio of HER2/CEN 17 is within the range < 2.0 of HER2/CEN 17 and a copy number of HER2 signals per cell is <4.0. Arch Pathol Lab Med 1:1,2018 Thressa Sheller MD Pathologist, Electronic Signature ( Signed 12/05/2019) E-cadherin is POSITIVE supporting a ductal origin. Thressa Sheller MD Pathologist, Electronic Signature ( Signed 12/04/2019) PROGNOSTIC INDICATORS Results: IMMUNOHISTOCHEMICAL AND MORPHOMETRIC ANALYSIS PERFORMED MANUALLY The tumor cells are EQUIVOCAL for Her2 (2+). HER2 by FISH  will be PREFORMED and the RESULTS REPORTED SEPARATELY 1 of 3 Duplicate copy FINAL for Rhonda Keller, Rhonda Keller ([id number]) ADDITIONAL INFORMATION:(continued) Estrogen Receptor: >95%, POSITIVE, STRONG STAINING INTENSITY Progesterone Receptor: >95%, POSITIVE, STRONG STAINING INTENSITY Proliferation Marker Ki67: 10% REFERENCE RANGE ESTROGEN RECEPTOR NEGATIVE 0% POSITIVE =>1% REFERENCE RANGE PROGESTERONE RECEPTOR NEGATIVE 0% POSITIVE =>1% All controls stained appropriately Thressa Sheller MD Pathologist, Electronic Signature ( Signed 12/05/2019) FINAL DIAGNOSIS Diagnosis Breast, right, needle core biopsy, lower inner - 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 Based on the biopsy, the carcinoma appears Nottingham grade 2 of 3 and measures 0.6 cm in greatest linear extent. E-cadherin and prognostic markers (ER/PR/ki-67/HER2)are pending and will be reported in an addendum. Dr. Jeannie Done reviewed the case and agrees with the above diagnosis. These results were called to [facility] on December 01, 2019. Thressa Sheller MD Pathologist, Electronic Signature (Case signed 12/01/2019) Specimen Gross and Clinical Information Specimen Comment TIF: 638 AM; extracted 3 min; history of bilateral lumpectomies, new right breast asymmetry Specimen(s) Obtained: Breast, right, needle core biopsy, lower inner Specimen Clinical         ADDENDUM REPORT: 11/30/2019 09:08 ADDENDUM: EXAM: DIGITAL DIAGNOSTIC RIGHT MAMMOGRAM WITH TOMO; ULTRASOUND RIGHT BREAST Electronically Signed By: Audie Pinto M.D. On: 11/30/2019 09:08 Addended by Tracey Harries, MD on 11/30/2019 11:08 AM  Study Result CLINICAL DATA: 59 year old female presenting as a recall from screening for  possible right breast asymmetry. History of bilateral breast cancers status post lumpectomy and radiation. EXAM: DIGITAL DIAGNOSTIC BILATERAL MAMMOGRAM WITH TOMO ULTRASOUND RIGHT BREAST COMPARISON: Previous exam(s). ACR Breast Density Category b: There are  scattered areas of fibroglandular density. FINDINGS: Mammogram: Additional spot compression tomosynthesis views performed for the questioned asymmetry in the lower inner right breast. On the additional imaging there is persistence of a vague asymmetry measuring approximately 1.2 cm in the posterior aspect of the breast. There are no additional findings. Ultrasound: Targeted ultrasound is performed throughout the lower inner quadrant of the right breast demonstrating no suspicious cystic or solid mass or other finding to correspond to the asymmetry seen mammographically. Targeted ultrasound of the right axilla demonstrates normal-appearing lymph nodes. IMPRESSION: Right breast asymmetry measuring approximately 1.2 cm without sonographic correlate is indeterminate. Tissue sampling is recommended. RECOMMENDATION: Stereotactic core needle biopsy of the right breast. This will be scheduled at the patient's earliest convenience. BI-RADS CATEGORY 4: Suspicious. Electronically Signed: By: Audie Pinto M.D. On: 11/29/2019 14:50.  The patient is a 59 year old female.   Past Surgical History Tawni Pummel, RN; 12/06/2019 7:28 AM) Oral Surgery Tonsillectomy  Diagnostic Studies History Tawni Pummel, RN; 12/06/2019 7:28 AM) Colonoscopy 1-5 years ago Mammogram within last year Pap Smear 1-5 years ago  Medication History Tawni Pummel, RN; 12/06/2019 7:29 AM) Medications Reconciled  Social History Tawni Pummel, RN; 12/06/2019 7:28 AM) Alcohol use Occasional alcohol use. Caffeine use Carbonated beverages. No drug use Tobacco use Never smoker.  Family History Tawni Pummel, RN; 12/06/2019 7:28 AM) Breast  Cancer Mother. Heart Disease Father, Mother. Heart disease in female family member before age 10 Heart disease in female family member before age 50 Hypertension Father, Mother.  Pregnancy / Birth History Tawni Pummel, RN; 12/06/2019 7:28 AM) Age at menarche 27 years. Age of menopause <44 Contraceptive History Oral contraceptives. Gravida 0 Para 0 Regular periods  Other Problems Tawni Pummel, RN; 12/06/2019 7:28 AM) Breast Cancer Depression Gastroesophageal Reflux Disease Melanoma     Review of Systems Sunday Spillers Ledford RN; 12/06/2019 7:29 AM) General Not Present- Appetite Loss, Chills, Fatigue, Fever, Night Sweats, Weight Gain and Weight Loss. Skin Not Present- Change in Wart/Mole, Dryness, Hives, Jaundice, New Lesions, Non-Healing Wounds, Rash and Ulcer. HEENT Present- Hearing Loss, Ringing in the Ears and Seasonal Allergies. Not Present- Earache, Hoarseness, Nose Bleed, Oral Ulcers, Sinus Pain, Sore Throat, Visual Disturbances, Wears glasses/contact lenses and Yellow Eyes. Respiratory Not Present- Bloody sputum, Chronic Cough, Difficulty Breathing, Snoring and Wheezing. Breast Not Present- Breast Mass, Breast Pain, Nipple Discharge and Skin Changes. Cardiovascular Present- Leg Cramps and Swelling of Extremities. Not Present- Chest Pain, Difficulty Breathing Lying Down, Palpitations, Rapid Heart Rate and Shortness of Breath. Gastrointestinal Not Present- Abdominal Pain, Bloating, Bloody Stool, Change in Bowel Habits, Chronic diarrhea, Constipation, Difficulty Swallowing, Excessive gas, Gets full quickly at meals, Hemorrhoids, Indigestion, Nausea, Rectal Pain and Vomiting. Female Genitourinary Not Present- Frequency, Nocturia, Painful Urination, Pelvic Pain and Urgency. Musculoskeletal Not Present- Back Pain, Joint Pain, Joint Stiffness, Muscle Pain, Muscle Weakness and Swelling of Extremities. Neurological Present- Trouble walking. Not Present- Decreased Memory,  Fainting, Headaches, Numbness, Seizures, Tingling, Tremor and Weakness. Psychiatric Present- Depression. Not Present- Anxiety, Bipolar, Change in Sleep Pattern, Fearful and Frequent crying. Endocrine Not Present- Cold Intolerance, Excessive Hunger, Hair Changes, Heat Intolerance, Hot flashes and New Diabetes. Hematology Not Present- Blood Thinners, Easy Bruising, Excessive bleeding, Gland problems, HIV and Persistent Infections.   Physical Exam (Trace Wirick A. Harald Quevedo MD; 12/06/2019 2:49 PM)  General Mental Status-Alert. General Appearance-Consistent with stated age. Hydration-Well hydrated. Voice-Normal.  Chest and Lung Exam Note: WOB normal no wheezing  Breast Note: RIGHT AND LEFT BREAST SHOW POST RADIATION CHANGES SND SCARS FROM PREVIOUS SURGEY  SCARS NOTED BILATERAL AXILLA MILD COSMETIC DEFECT RIGH TBREAST NO MASSES  Cardiovascular Note: SR no JVD  Neurologic Note: WALKS WITH WALKER A/O X4 NO OTHER DEFICETS  Neuropsychiatric The patient's mood and affect are described as -normal. Associations-intact. Judgment and Insight-insight is appropriate concerning matters relevant to self.  Musculoskeletal Global Assessment -Note:USED WALKER TO AMBULATE.   Lymphatic Axillary  General Axillary Region: Bilateral - Description - Normal. Tenderness - Non Tender.    Assessment & Plan (Zelig Gacek A. Damire Remedios MD; 12/06/2019 2:48 PM)  BREAST CANCER, STAGE 1, RIGHT (C50.911) Impression: DISCUSSED OPTUION OF BREAST CONSERVING SURGERY VS MASTECTOMY SHE WOULD LIKE TO PROCEED WITH BILATERAL SIMPLE MASTECTOMY WITHOUT RECONSTRUCTION UNCLEAR WHAT KIND OF SURGERY SHE HAD ON HER LN basins  given previous radiation, this is her best option with risk reducing left mastectomy  Discussed treatment options for breast cancer to include breast conservation vs mastectomy with reconstruction. Pt has decided on mastectomy. Risk include bleeding, infection, flap necrosis, pain, numbness,  recurrence, hematoma, other surgery needs. Pt understands and agrees to proceed.  total time 45 minutes reviewing case consulting with other team members, examining and obtaining hx from patient  Current Plans You are being scheduled for surgery- Our schedulers will call you.  You should hear from our office's scheduling department within 5 working days about the location, date, and time of surgery. We try to make accommodations for patient's preferences in scheduling surgery, but sometimes the OR schedule or the surgeon's schedule prevents [place] from making those accommodations.  If you have not heard from our office [phone]) in 5 working days, call the office and ask for your surgeon's nurse.  If you have other questions about your diagnosis, plan, or surgery, call the office and ask for your surgeon's nurse.  Pt Education - [facility] Mastectomy HCI Pt Education - ABC (After Breast Cancer) Class Info: discussed with patient and provided information. Pt Education - [facility] Breast Pains Education We discussed the staging and pathophysiology of breast cancer. We discussed all of the different options for treatment for breast cancer including surgery, chemotherapy, radiation therapy, Herceptin, and antiestrogen therapy. We discussed a sentinel lymph node biopsy as she does not appear to having lymph node involvement right now. We discussed the performance of that with injection of radioactive tracer and blue dye. We discussed that she would have an incision underneath her axillary hairline. We discussed that there is a bout a 10-20% chance of having a positive node with a sentinel lymph node biopsy and we will await the permanent pathology to make any other first further decisions in terms of her treatment. One of these options might be to return to the operating room to perform an axillary lymph node dissection. We discussed about a 1-2% risk lifetime of chronic shoulder pain as well as lymphedema  associated with a sentinel lymph node biopsy. We discussed the options for treatment of the breast cancer which included lumpectomy versus a mastectomy. We discussed the performance of the lumpectomy with a wire placement. We discussed a 10-20% chance of a positive margin requiring reexcision in the operating room. We also discussed that she may need radiation therapy or antiestrogen therapy or both if she undergoes lumpectomy. We discussed the mastectomy and the postoperative care for that as well. We discussed that there is no difference in her survival whether she undergoes lumpectomy with radiation therapy or antiestrogen therapy versus a mastectomy. There is a slight difference in the local recurrence rate being 3-5% with lumpectomy and about  1% with a mastectomy. We discussed the risks of operation including bleeding, infection, possible reoperation. She understands her further therapy will be based on what her stages at the time of her operation.  Pt Education - [facility] breast cancer surgery: discussed with patient and provided information.  BREAST CANCER, STAGE 1, RIGHT (C50.911)

## 2019-12-06 NOTE — Progress Notes (Signed)
Radiation Oncology         ([phone]) [phone] ________________________________  Name: Rhonda Keller        MRN: [id number]  Date of Service: 12/06/2019 DOB: [birth year]  [id number], No Pcp Per  Erroll Luna, MD     REFERRING PHYSICIAN: Erroll Luna, MD   DIAGNOSIS: The primary encounter diagnosis was Infiltrating duct carcinoma of right breast, estrogen receptor positive, stage 2 ([place]). A diagnosis of Malignant neoplasm of lower-inner quadrant of right breast of female, estrogen receptor positive ([place]) was also pertinent to this visit.   HISTORY OF PRESENT ILLNESS: Rhonda Keller is a 59 y.o. female seen in the multidisciplinary breast clinic for a history of bilateral breast cancer.  She was diagnosed with right breast cancer that was ER negative, node positive in May 1999, she received lumpectomy, breast and regional node radiotherapy and chemotherapy.  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 Her breast treatment was in [place] with Dr. Valere Dross and Dr. Beryle Beams. She had a recent screening mammogram that revealed a distortion inferior to her previous right breast lumpectomy site, her axilla was negative for adenopathy.  The lesion in the breast measured 1.2 cm and was more posterior to the lumpectomy site by mammography but did not have a sonographic correlate.  She underwent a biopsy of the lower inner quadrant of the right breast on 11/30/2019 revealing a grade 2,  invasive ductal carcinoma, which was ER/PR positive, HER-2 negative with a Ki-67 of 10%.  She is seen today to discuss the rationale for further treatment of her cancer.   PREVIOUS RADIATION THERAPY: Yes   08/21/2002 through 10/13/2002 left breast was  treated to 45 Gy in 25 fractions with a 18 Gy boost in 10 fractions  11/11/01-12/30/01 the right breast was treated to 45 Gy and 25 fractions, the regional lymph nodes received 50.4 Gy in 28 fractions, and then tumor bed boost with 16 Gy in 8 fractions  1985: The patient received postop radiotherapy to her spine from the low Thoracic to the upper Lumbar Spine. Details unknown, the patient was treated in [place].    PAST MEDICAL HISTORY:  Past Medical History:  Diagnosis Date  . Breast cancer ([place])   . 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 Dysphagia, idiopathic 03/29/2012  . Ewing's sarcoma of bone [facility]) 03/29/2012   Dec 1985 presenting with cord compression  Rx surgery, RT, chemo  . Personal history of chemotherapy   . Personal history of radiation therapy        PAST SURGICAL HISTORY: Past Surgical History:  Procedure Laterality Date  . BACK SURGERY    . BREAST BIOPSY    . BREAST LUMPECTOMY     bilateral   . BREAST SURGERY       FAMILY HISTORY:  Family History  Problem Relation Age of Onset  . Breast cancer Mother      SOCIAL HISTORY:  reports that she has never smoked. She does not have any smokeless tobacco history on file. She reports that she does not drink alcohol or use drugs.  The patient is single and lives in [place]. She is a  Manufacturing engineer.  ALLERGIES: Penicillins   MEDICATIONS:  No current outpatient medications on file.   No current facility-administered medications for this visit.     REVIEW OF SYSTEMS: On review of systems, the patient reports that she is doing well overall. She denies any chest pain, shortness of breath, cough, fevers, chills, night sweats, unintended weight changes. She denies any bowel or bladder disturbances, and denies abdominal pain, nausea or vomiting. She denies any new musculoskeletal or joint aches or pains. A complete review of systems is obtained  and is otherwise negative.     PHYSICAL EXAM:  Wt Readings from Last 3 Encounters:  11/13/13 203 lb (92.1 kg)  04/04/13 203 lb 11.2 oz (92.4 kg)  03/29/12 210 lb (95.3 kg)   Temp Readings from Last 3 Encounters:  11/13/13 98.3 F (36.8 C) (Oral)  04/04/13 98.1 F (36.7 C) (Oral)  03/29/12 97.7 F (36.5 C) (Oral)   BP Readings from Last 3 Encounters:  11/13/13 132/56  04/04/13 107/65  03/29/12 126/63   Pulse Readings from Last 3 Encounters:  11/13/13 80  04/04/13 85  03/29/12 86    In general this is a well appearing Caucasian female in no acute distress. She's alert and oriented x4 and appropriate throughout the examination. Cardiopulmonary assessment is negative for acute distress and she exhibits normal effort. Bilateral breast exam is deferred.    ECOG = 0  0 - Asymptomatic (Fully active, able to carry on all predisease activities without restriction)  1 - Symptomatic but completely ambulatory (Restricted in physically strenuous activity but ambulatory and able to carry out work of a light or sedentary nature. For example, light housework, office work)  2 - Symptomatic, <50% in bed during the day (Ambulatory and capable of all self care but unable to carry out any work activities. Up and about more than 50% of waking hours)  3 - Symptomatic, >50% in bed, but not bedbound (Capable of only limited self-care, confined to bed or chair 50% or more of waking hours)  4 - Bedbound (Completely disabled. Cannot carry on any self-care. Totally confined to bed or chair)  5 - Death   Eustace Pen MM, Creech RH, Tormey DC, et al. [phone]). "Toxicity and response criteria of the [facility]". [place] Oncol. 5 (6): 649-55    LABORATORY DATA:  Lab Results  Component Value Date   WBC 6.3 03/28/2013   HGB 12.0 03/28/2013   HCT 36.6 03/28/2013   MCV 77.1 (L) 03/28/2013   PLT 181 03/28/2013   Lab Results  Component Value Date   NA 143 03/28/2013   K 4.3  03/28/2013   CL 103 03/23/2012   CO2 30 (H) 03/28/2013   Lab Results  Component Value Date   ALT 19 03/28/2013   AST 22 03/28/2013   ALKPHOS 67 03/28/2013   BILITOT 0.24 03/28/2013      RADIOGRAPHY: US BREAST LTD UNI RIGHT INC AXILLA  Addendum Date: 11/30/2019   ADDENDUM REPORT: 11/30/2019 09:08 ADDENDUM: EXAM: DIGITAL DIAGNOSTIC RIGHT MAMMOGRAM WITH TOMO; ULTRASOUND RIGHT BREAST Electronically Signed   By: Audie Pinto M.D.   On: 11/30/2019 09:08   Result Date: 11/30/2019 CLINICAL DATA:  59 year old female presenting as a recall from screening for possible right breast asymmetry. History of bilateral breast cancers status post lumpectomy and radiation. EXAM: DIGITAL DIAGNOSTIC BILATERAL MAMMOGRAM WITH TOMO ULTRASOUND RIGHT BREAST COMPARISON:  Previous exam(s). ACR Breast Density Category b: There are scattered areas of fibroglandular density. FINDINGS: Mammogram:  Additional spot compression tomosynthesis views performed for the questioned asymmetry in the lower inner right breast. On the additional imaging there is persistence of a vague asymmetry measuring approximately 1.2 cm in the posterior aspect of the breast. There are no additional findings. Ultrasound: Targeted ultrasound is performed throughout the lower inner quadrant of the right breast demonstrating no suspicious cystic or solid mass or other finding to correspond to the asymmetry seen mammographically. Targeted ultrasound of the right axilla demonstrates normal-appearing lymph nodes. IMPRESSION: Right breast asymmetry measuring approximately 1.2 cm without sonographic correlate is indeterminate. Tissue sampling is recommended. RECOMMENDATION: Stereotactic core needle biopsy of the right breast. This will be scheduled at the patient's earliest convenience. BI-RADS CATEGORY  4: Suspicious. Electronically Signed: By: Audie Pinto M.D. On: 11/29/2019 14:50   MM DIAG BREAST TOMO UNI RIGHT  Addendum Date: 11/30/2019   ADDENDUM  REPORT: 11/30/2019 09:08 ADDENDUM: EXAM: DIGITAL DIAGNOSTIC RIGHT MAMMOGRAM WITH TOMO; ULTRASOUND RIGHT BREAST Electronically Signed   By: Audie Pinto M.D.   On: 11/30/2019 09:08   Result Date: 11/30/2019 CLINICAL DATA:  59 year old female presenting as a recall from screening for possible right breast asymmetry. History of bilateral breast cancers status post lumpectomy and radiation. EXAM: DIGITAL DIAGNOSTIC BILATERAL MAMMOGRAM WITH TOMO ULTRASOUND RIGHT BREAST COMPARISON:  Previous exam(s). ACR Breast Density Category b: There are scattered areas of fibroglandular density. FINDINGS: Mammogram: Additional spot compression tomosynthesis views performed for the questioned asymmetry in the lower inner right breast. On the additional imaging there is persistence of a vague asymmetry measuring approximately 1.2 cm in the posterior aspect of the breast. There are no additional findings. Ultrasound: Targeted ultrasound is performed throughout the lower inner quadrant of the right breast demonstrating no suspicious cystic or solid mass or other finding to correspond to the asymmetry seen mammographically. Targeted ultrasound of the right axilla demonstrates normal-appearing lymph nodes. IMPRESSION: Right breast asymmetry measuring approximately 1.2 cm without sonographic correlate is indeterminate. Tissue sampling is recommended. RECOMMENDATION: Stereotactic core needle biopsy of the right breast. This will be scheduled at the patient's earliest convenience. BI-RADS CATEGORY  4: Suspicious. Electronically Signed: By: Audie Pinto M.D. On: 11/29/2019 14:50   MM CLIP PLACEMENT RIGHT  Result Date: 11/30/2019 CLINICAL DATA:  Post biopsy mammogram of right breast for clip placement. EXAM: DIAGNOSTIC RIGHT MAMMOGRAM POST STEREOTACTIC BIOPSY COMPARISON:  Previous exam(s). FINDINGS: Mammographic images were obtained following stereotactic guided biopsy of an asymmetry in the lower-inner right breast. The biopsy  marking clip is approximately 1.5 cm medial to the biopsy target. IMPRESSION: The coil shaped biopsy marking clip is approximately 1.5 cm medial to the biopsy target. Final Assessment: Post Procedure Mammograms for Marker Placement Electronically Signed   By: Ammie Ferrier M.D.   On: 11/30/2019 08:36   MM RT BREAST BX W LOC DEV 1ST LESION IMAGE BX SPEC STEREO GUIDE  Addendum Date: 12/01/2019   ADDENDUM REPORT: 12/01/2019 11:13 ADDENDUM: Pathology revealed GRADE II INVASIVE MAMMARY CARCINOMA, MAMMARY CARCINOMA IN-SITU of the RIGHT breast, lower inner. Note that coil clip is 1.5 cm medial to the asymmetry. This was found to be concordant by Dr. Ammie Ferrier. Pathology results were discussed with the patient by telephone. The patient reported doing well after the biopsy with tenderness at the site. Post biopsy instructions and care were reviewed and questions were answered. The patient was encouraged to call [facility] for any additional concerns. The patient was referred to [facility]  [place] on December 06, 2019. Pathology results reported by Stacie Acres RN on 12/01/2019. Electronically Signed   By: Ammie Ferrier M.D.   On: 12/01/2019 11:13   Result Date: 12/01/2019 CLINICAL DATA:  59 year old female presenting for stereotactic biopsy of a right breast asymmetry. EXAM: RIGHT BREAST STEREOTACTIC CORE NEEDLE BIOPSY COMPARISON:  Previous exams. FINDINGS: The patient and I discussed the procedure of stereotactic-guided biopsy including benefits and alternatives. We discussed the high likelihood of a successful procedure. We discussed the risks of the procedure including infection, bleeding, tissue injury, clip migration, and inadequate sampling. Informed written consent was given. The usual time out protocol was performed immediately prior to the procedure. Using sterile technique and 1% Lidocaine as local  anesthetic, under stereotactic guidance, a 9 gauge vacuum assisted device was used to perform core needle biopsy of an asymmetry in the medial posterior right breast using a medial approach. Lesion quadrant: Lower-inner quadrant At the conclusion of the procedure, coil shaped tissue marker clip was deployed into the biopsy cavity. Follow-up 2-view mammogram was performed and dictated separately. IMPRESSION: Stereotactic-guided biopsy of an asymmetry in the lower-inner. No apparent complications. Electronically Signed: By: Ammie Ferrier M.D. On: 11/30/2019 08:33       IMPRESSION/PLAN: 1. Stage IA, [id number] grade 2, ER/PR positive invasive ductal carcinoma of the right breast with a history of stage II node positive ER/PR negative invasive ductal carcinoma in the 1990s. Dr. Lisbeth Renshaw discusses the pathology findings and reviews the nature of right breast disease. The consensus from the breast conference includes mastectomy given her history of previous radiation to both breasts, she also had nodal irradiation at that time back in the 1990s when she was treated for her right breast cancer. She has elected to proceed with bilateral mastectomy, and Dr. Lisbeth Renshaw discusses with her that based on her prior radiotherapy she would not be a candidate for additional radiation. We will follow along with her surgical outcome and would be happy to revisit this discussion if there was a change in her clinical status. 2. 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  On her screening study, she did not appear to have concerns for any disease in the breast on the left, but this will be followed expectantly given her history 3. Remote history of Ewing sarcoma.  She is no longer followed in surveillance but will be followed expectantly for her other cancer history. 4.  Possible genetic predisposition to malignancy. The patient is a candidate for genetic testing given her personal and family history. She was  offered referral and will meet with genetics today.  In a visit lasting 45 minutes, greater than 50% of the time was spent face to face reviewing her case, as well as in preparation of, discussing, and coordinating the patient's care.  The above documentation reflects my direct findings during this shared patient visit. Please see the separate note by Dr. Lisbeth Renshaw on this date for the remainder of the patient's plan of care.    Carola Rhine, PAC

## 2019-12-06 NOTE — Progress Notes (Signed)
Clinical Social Work Rhonda Psychosocial Distress Screening Keller   Patient completed distress screening protocol and scored a 10 on the Psychosocial Distress Thermometer which indicates severe distress. Clinical Social Worker met with patient in [facility] to assess for distress and other psychosocial needs.  Patient stated she was feeling overwhelmed. This is her fourth cancer diagnosis which she found out on the day her mother was admitted to hospice. Patient struggling with anxiety and depression secondary to decreased mobility (using a walker and scooter) and is now not sure if she wants to pursue treatment (bilateral mastectomy) for her breast cancer despite good prognosis with treatment.    Patient reports strong support system of friends and church family and dad, but has only told one friend about her new diagnosis. No significant other or children. She is terrified of continued decline in mobility and then needing to go to a nursing home. CSW provided supportive counsel and began exploration of values. Rhonda Keller was not open to therapy but may be willing to speak with our chaplain or this CSW in the future as she makes her decision.   CSW informed patient of the support team and support services at [facility]  CSW provided contact information and encouraged patient to call with any questions or concerns.    Distress Screen: ONCBCN DISTRESS SCREENING 12/06/2019  Screening Type Initial Screening  Distress experienced in past week (1-10) 10  Family Problem type Other (comment)  Emotional problem type Depression;Nervousness/Anxiety;Adjusting to illness;Isolation/feeling alone;Feeling hopeless  Spiritual/Religous concerns type Facing my mortality  Physical Problem type Getting around;Bathing/dressing;Changes in urination;Tingling hands/feet  Referral to clinical social work Yes  Referral to support programs Yes  Other reach by email or cell     [place]

## 2019-12-06 NOTE — Patient Instructions (Signed)

## 2019-12-06 NOTE — Therapy (Signed)
[place], [place], [address] Phone: [phone]   Fax:  [phone]  Physical Therapy Evaluation  Patient Details  Name: Rhonda Keller MRN: [id number] Date of Birth: 07-Sep-1961 Referring Provider (PT): Dr. Erroll Luna   Encounter Date: 12/06/2019  PT End of Session - 12/06/19 2209    Visit Number  1    Number of Visits  2    Date for PT Re-Evaluation  01/31/20    PT Start Time  4462    PT Stop Time  8638   Also saw pt from 1430-1451 for a total of 38 minutes   PT Time Calculation (min)  17 min    Activity Tolerance  Patient tolerated treatment well    Behavior During Therapy  [facility] for tasks assessed/performed       Past Medical History:  Diagnosis Date  . Breast cancer ([place])   . 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 Dysphagia, idiopathic 03/29/2012  . 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 Personal history of chemotherapy   . Personal history of radiation therapy     Past Surgical History:  Procedure Laterality Date  . BACK SURGERY    . BREAST BIOPSY    . BREAST LUMPECTOMY     bilateral   . BREAST SURGERY      There were no vitals filed for this visit.   Subjective Assessment - 12/06/19 2131    Subjective  Patient reports she is here today to be seen by her medical team for her nelwy diagnosed right breast cancer.    Pertinent History  Patient was diagnosed with right grade II invasive ductal carcinoma breast cancer. It measures 1.2 cm and is located in the lower inner quadrant. It is ER/PR positive and HER2 negative with a Ki67 of 10%. She has a history of a Ewing's Sarcoma which has effected her function and ambulation. She has a history of right breast cancer in 1999 and had a lumpectomy, axillary lymph node dissection, radiation,and  chemotherapy. It was ER/PR negative. She then had left breast DCIS in 2003 which was ER/PR negative and underwent a lumpectomy, sentinel node biopsy, and radiation. She reports depression due to her limited function and her Mom's current illness which now requires Hospice.    Patient Stated Goals  Reduce lymphedema risk and learn post op shoulder ROM HEP    Currently in Pain?  Yes    Pain Score  4     Pain Location  Finger (Comment which one)   Fingers 1-3 on right   Pain Orientation  Right    Pain Descriptors / Indicators  Numbness    Pain Type  Chronic pain    Pain Onset  More than a month ago    Pain Frequency  Constant    Aggravating Factors   Nothing    Pain Relieving Factors  Nothing         [facility] PT Assessment - 12/06/19 0001      Assessment   Medical Diagnosis  Right breast cancer    Referring Provider (PT)  Dr. Marcello Moores Cornett    Onset Date/Surgical Date  11/24/19    Hand Dominance  Right    Prior Therapy  none      Precautions   Precautions  Other (comment)    Precaution Comments  active cancer      Restrictions   Weight Bearing Restrictions  No      Balance Screen   Has the patient fallen in the past 6 months  No    Has the patient had a decrease in activity level because of a fear of falling?   No    Is the patient reluctant to leave their home because of a fear of falling?   No      Home Environment   Living Environment  Private residence    Living Arrangements  Alone    Available Help at Discharge  Family      Prior Function   Level of Independence  Independent    Vocation  Full time employment    Probation officer work at [employer]  She does not (is not able to) exercise      Cognition   Overall Cognitive Status  Within Functional Limits for tasks assessed      Posture/Postural Control   Posture/Postural Control  Postural limitations    Postural Limitations  Rounded Shoulders;Forward head      ROM / Strength   AROM /  PROM / Strength  AROM;Strength      AROM   Overall AROM Comments  Cervical AROM is WNL; shoulder IR/ER not measured due to limitations in sitting    AROM Assessment Site  Shoulder    Right/Left Shoulder  Right;Left    Right Shoulder Extension  50 Degrees    Right Shoulder Flexion  147 Degrees    Right Shoulder ABduction  143 Degrees    Left Shoulder Extension  42 Degrees    Left Shoulder Flexion  142 Degrees    Left Shoulder ABduction  136 Degrees      Strength   Overall Strength  Unable to assess      Transfers   Transfers  Sit to Stand   Able to perform independently but with great difficulty     Ambulation/Gait   Gait Comments  Uses 2 wheel walker most of the time; in wheelchair today due to longer distances.        LYMPHEDEMA/ONCOLOGY QUESTIONNAIRE - 12/06/19 2159      Type   Cancer Type  Right breast      Lymphedema Assessments   Lymphedema Assessments  Upper extremities      Right Upper Extremity Lymphedema   10 cm Proximal to Olecranon Process  37.5 cm    Olecranon Process  29 cm    10 cm Proximal to Ulnar Styloid Process  28.4 cm    Just Proximal to Ulnar Styloid Process  18.2 cm    Across Hand at [employer]  21.1 cm    At [place] of 2nd Digit  6.9 cm      Left Upper Extremity Lymphedema   10 cm Proximal to Olecranon Process  38.2 cm    Olecranon Process  30.3 cm    10 cm Proximal to Ulnar Styloid Process  27.9 cm    Just Proximal to Ulnar Styloid Process  18.4 cm    Across Hand at [employer]  19.8 cm    At [place] of 2nd Digit  6.6 cm          Quick Dash - 12/06/19 0001    Open a tight or new jar  No difficulty    Do heavy household chores (wash  walls, wash floors)  Severe difficulty    Carry a shopping bag or briefcase  Moderate difficulty    Wash your back  Moderate difficulty    Use a knife to cut food  No difficulty    Recreational activities in which you take some force or impact through your arm, shoulder, or hand (golf, hammering,  tennis)  Unable    During the past week, to what extent has your arm, shoulder or hand problem interfered with your normal social activities with family, friends, neighbors, or groups?  Not at all    During the past week, to what extent has your arm, shoulder or hand problem limited your work or other regular daily activities  Not at all    Arm, shoulder, or hand pain.  Mild    Tingling (pins and needles) in your arm, shoulder, or hand  Mild    Difficulty Sleeping  No difficulty    DASH Score  29.55 %        Objective measurements completed on examination: See above findings.        Patient was instructed today in a home exercise program today for post op shoulder range of motion. These included active assist shoulder flexion in sitting, scapular retraction, wall walking with shoulder abduction, and hands behind head external rotation.  She was encouraged to do these twice a day, holding 3 seconds and repeating 5 times when permitted by her physician.          PT Education - 12/06/19 2203    Education Details  Lymphedema risk reduction and post op shoulder ROM HEP    Person(s) Educated  Patient    Methods  Explanation;Demonstration;Handout    Comprehension  Verbalized understanding          PT Long Term Goals - 12/06/19 2219      PT LONG TERM GOAL #1   Title  Patient will demonstrate she has regained shoulder ROM and function post operatively compared to baselines.    Time  8    Period  Weeks    Status  New    Target Date  01/31/20      Breast Clinic Goals - 12/06/19 2218      Patient will be able to verbalize understanding of pertinent lymphedema risk reduction practices relevant to her diagnosis specifically related to skin care.   Time  1    Period  Days    Status  Achieved      Patient will be able to return demonstrate and/or verbalize understanding of the post-op home exercise program related to regaining shoulder range of motion.   Time  1    Period   Days    Status  Achieved      Patient will be able to verbalize understanding of the importance of attending the postoperative After Breast Cancer Class for further lymphedema risk reduction education and therapeutic exercise.   Time  1    Period  Days    Status  Achieved            Plan - 12/06/19 2210    Clinical Impression Statement  Patient was diagnosed with right grade II invasive ductal carcinoma breast cancer. It measures 1.2 cm and is located in the lower inner quadrant. It is ER/PR positive and HER2 negative with a Ki67 of 10%. She has a history of a Ewing's Sarcoma which has effected her function and ambulation. She has a history of right breast cancer in  1999 and had a lumpectomy, axillary lymph node dissection, radiation,and chemotherapy. It was ER/PR negative. She then had left breast DCIS in 2003 which was ER/PR negative and underwent a lumpectomy, sentinel node biopsy, and radiation. She reports depression due to her limited function and her Mom's current illness which now requires Hospice. Her multidisciplinary medical team met prior to her assessments to determine a recommended treatment plan. She is planning to have a bilateral mastectomy and anti-estrogen therapy. She will benefit from post op PT to reassess as she is already at high risk for lymphedema from previous breast cancer.    Personal Factors and Comorbidities  Comorbidity 1;Social Background;Comorbidity 2    Comorbidities  Poor lower extremity function from Ewing's Sarcoma, depression, fall risk    Examination-Activity Limitations  Squat;Stand;Locomotion Level    Stability/Clinical Decision Making  Stable/Uncomplicated    Clinical Decision Making  Moderate    Rehab Potential  Good    PT Frequency  --   Eval and 1 f/u visit   PT Treatment/Interventions  ADLs/Self Care Home Management;Therapeutic exercise;Patient/family education    PT Next Visit Plan  Will reassess 3-4 weeks post op to determine needs    PT  Home Exercise Plan  Post op shoulder ROM HEP    Consulted and Agree with Plan of Care  Patient       Patient will benefit from skilled therapeutic intervention in order to improve the following deficits and impairments:  Pain, Impaired UE functional use, Decreased strength, Decreased knowledge of precautions, Postural dysfunction, Decreased range of motion, Decreased mobility, Difficulty walking, Decreased balance  Visit Diagnosis: Malignant neoplasm of lower-inner quadrant of right breast of female, estrogen receptor positive ([place]) - Plan: PT plan of care cert/re-cert  Abnormal posture - Plan: PT plan of care cert/re-cert   Patient will follow up at outpatient cancer rehab 3-4 weeks following surgery.  If the patient requires physical therapy at that time, a specific plan will be dictated and sent to the referring physician for approval. The patient was educated today on appropriate basic range of motion exercises to begin post operatively and the importance of attending the After Breast Cancer class following surgery.  Patient was educated today on lymphedema risk reduction practices as it pertains to recommendations that will benefit the patient immediately following surgery.  She verbalized good understanding.      Problem List Patient Active Problem List   Diagnosis Date Noted  . Malignant neoplasm of lower-inner quadrant of right breast of female, estrogen receptor positive ([place]) 12/04/2019  . Herpes zoster infection of thoracic region 04/04/2013  . Ductal carcinoma in situ (DCIS) of left breast 03/29/2012  . Ewing's sarcoma of bone ([place]) 03/29/2012  . Dysphagia, idiopathic 03/29/2012   Annia Friendly, PT 12/06/19 10:26 PM  [place], [place], [address] Phone: [phone]   Fax:  [phone]  Name: Rhonda Keller MRN: [id number] Date of Birth: 04-Jun-1961

## 2019-12-07 ENCOUNTER — Other Ambulatory Visit: Payer: Self-pay | Admitting: Neurological Surgery

## 2019-12-07 DIAGNOSIS — R29898 Other symptoms and signs involving the musculoskeletal system: Secondary | ICD-10-CM

## 2019-12-08 ENCOUNTER — Encounter: Payer: Self-pay | Admitting: General Practice

## 2019-12-08 NOTE — Progress Notes (Signed)
[place] Spiritual Care Note  Received referral from [facility] for emotional support. Left voicemail encouraging callback and will try again next week.   [employer], [place], [facility] Pager [phone] Voicemail [phone]

## 2019-12-11 ENCOUNTER — Encounter: Payer: Self-pay | Admitting: General Practice

## 2019-12-11 NOTE — Progress Notes (Signed)
[facility] Spiritual Care Note  Left voicemail encouraging callback.   Chaplain Cyprian Gong[place], [facility], [facility] Pager [phone] Voicemail [phone] 

## 2019-12-12 ENCOUNTER — Telehealth: Payer: Self-pay | Admitting: Oncology

## 2019-12-12 NOTE — Telephone Encounter (Signed)
Scheduled appt per 3/10 los. Left voicemail with appt details. Mailed reminder letter and calendar.

## 2019-12-13 ENCOUNTER — Telehealth: Payer: Self-pay

## 2019-12-13 NOTE — Telephone Encounter (Signed)
Nutrition  Patient identified by attending Breast Clinic on 12/06/19  Chart reviewed  Called patient and left message on voicemail with call back number.  Rhonda Keller B. Zenia Resides, [place], [place] Registered Dietitian [phone] (pager)

## 2019-12-14 ENCOUNTER — Telehealth: Payer: Self-pay | Admitting: *Deleted

## 2019-12-14 NOTE — Telephone Encounter (Signed)
Left vm regarding [facility] from 3.10.21. Contact information provided for questions or needs.

## 2019-12-19 ENCOUNTER — Telehealth: Payer: Self-pay | Admitting: Licensed Clinical Social Worker

## 2019-12-19 NOTE — Telephone Encounter (Signed)
LVM to follow-up after [facility]. Left contact information for additional support.    Edwinna Areola Stoisits, LCSW

## 2019-12-21 ENCOUNTER — Telehealth: Payer: Self-pay | Admitting: *Deleted

## 2019-12-21 NOTE — Telephone Encounter (Signed)
Left vm for to call Debbie at [place] to schedule surgery. Direct number give. Request return call with questions or needs. Contact information provided.

## 2019-12-22 ENCOUNTER — Telehealth: Payer: Self-pay | Admitting: Licensed Clinical Social Worker

## 2019-12-22 NOTE — Telephone Encounter (Signed)
Attempted to reach patient again to follow-up from [facility] and offer support around diagnosis and other stressors. Other team members have also been attempting to contact without success.  Today, no answer. Then, line picked up then immediately hung up.   Edwinna Areola Moua Rasmusson, LCSW

## 2019-12-25 ENCOUNTER — Telehealth: Payer: Self-pay | Admitting: Licensed Clinical Social Worker

## 2019-12-25 NOTE — Telephone Encounter (Signed)
Called Rhonda Keller to see if she is interested in scheduling a genetics appointment since she did not see [place] during [facility]. She said she is interested but would like to wait to schedule until she has other appointments scheduled here at the cancer center and see [place] on the same day. She has my number and will call once these are set up.

## 2019-12-27 ENCOUNTER — Encounter: Payer: Self-pay | Admitting: *Deleted

## 2020-01-02 ENCOUNTER — Encounter: Payer: Self-pay | Admitting: *Deleted

## 2020-01-02 NOTE — Progress Notes (Signed)
Attempted to call patient while leaving a voicemail I was interrupted by a statement that my message was cancelled and then it hung up.  The surgeon's office has been trying to reach her to schedule surgery but she has not returned any of their phone calls.  Our social workers have tried to contact her as well. Message sent to notify the team

## 2020-01-15 ENCOUNTER — Telehealth: Payer: Self-pay | Admitting: Adult Health

## 2020-01-15 NOTE — Telephone Encounter (Signed)
Cancelled 4/21 appt per pt's request.

## 2020-01-16 ENCOUNTER — Other Ambulatory Visit: Payer: Self-pay | Admitting: Oncology

## 2020-01-16 ENCOUNTER — Telehealth: Payer: Self-pay | Admitting: Nurse Practitioner

## 2020-01-16 ENCOUNTER — Other Ambulatory Visit: Payer: Self-pay | Admitting: Neurological Surgery

## 2020-01-17 ENCOUNTER — Ambulatory Visit
Admission: RE | Admit: 2020-01-17 | Discharge: 2020-01-17 | Disposition: A | Discharge status: Home / Self Care | Payer: 59 | Source: Ambulatory Visit | Attending: Neurological Surgery | Admitting: Neurological Surgery

## 2020-01-17 ENCOUNTER — Inpatient Hospital Stay: Payer: 59 | Admitting: Oncology

## 2020-01-17 DIAGNOSIS — R29898 Other symptoms and signs involving the musculoskeletal system: Secondary | ICD-10-CM

## 2020-01-17 MED ORDER — GADOBENATE DIMEGLUMINE 529 MG/ML IV SOLN
20.0000 mL | Freq: Once | INTRAVENOUS | Status: AC | PRN
  Administered 2020-01-17: 20 mL via INTRAVENOUS

## 2020-02-08 ENCOUNTER — Telehealth: Payer: Self-pay | Admitting: *Deleted

## 2020-02-08 MED ORDER — ANASTROZOLE 1 MG PO TABS: 1.0000 mg | ORAL_TABLET | Freq: Every day | ORAL | 6 refills | Status: DC

## 2020-02-08 NOTE — Telephone Encounter (Signed)
Pt called to discuss possible treatment.  Pt relate the day she came to see the physician team in [place], she had just placed her mother in hospice. Her mother passed 5 days later.  She is concerned about doing bil mastectomy d/t her reliability on her arms to move around. She has left foot drip and right sided weakness d/t the treatment of her bone cancer when she was 24. She is able to move around with a walker, but feels she will soon be relying on a wheelchair at some point. She lives alone. She would really like to have a lumpectomy if at all possible d/t her mobility issues and reliability on her upper body strength.  Pt agrees to take anastrozole. Pt concerns and plan for possible treatment relayed to physician team. Informed pt will reach out when the team has reviewed her case. Denies further questions or needs at this time.

## 2020-02-09 ENCOUNTER — Telehealth: Payer: Self-pay | Admitting: Oncology

## 2020-02-09 NOTE — Telephone Encounter (Signed)
Scheduled appt per 5/14 sch message - pt is aware of appt date and time   

## 2020-02-12 ENCOUNTER — Telehealth: Payer: Self-pay | Admitting: *Deleted

## 2020-02-12 NOTE — Telephone Encounter (Signed)
Pt called concerning appt with Dr. Jana Hakim on 5/21. Pt confirmed appt and would like it to be changed to a telephone visit as she has difficulty "getting around". Scheduling msg sent to change appt type and Dr. Jana Hakim made aware of request.  Pt wishes to pursue lumpectomy if possible knowing not standard of care. Dr. Brantley Stage notified of pt request.

## 2020-02-13 ENCOUNTER — Ambulatory Visit: Payer: Self-pay | Admitting: Surgery

## 2020-02-16 ENCOUNTER — Inpatient Hospital Stay: Payer: 59 | Attending: Oncology | Admitting: Oncology

## 2020-02-16 DIAGNOSIS — D0512 Intraductal carcinoma in situ of left breast: Secondary | ICD-10-CM

## 2020-02-16 DIAGNOSIS — N281 Cyst of kidney, acquired: Secondary | ICD-10-CM | POA: Insufficient documentation

## 2020-02-16 DIAGNOSIS — Z79899 Other long term (current) drug therapy: Secondary | ICD-10-CM | POA: Diagnosis not present

## 2020-02-16 DIAGNOSIS — M549 Dorsalgia, unspecified: Secondary | ICD-10-CM | POA: Insufficient documentation

## 2020-02-16 DIAGNOSIS — B029 Zoster without complications: Secondary | ICD-10-CM | POA: Diagnosis not present

## 2020-02-16 DIAGNOSIS — M48061 Spinal stenosis, lumbar region without neurogenic claudication: Secondary | ICD-10-CM | POA: Insufficient documentation

## 2020-02-16 DIAGNOSIS — Z170 Estrogen receptor positive status [ER+]: Secondary | ICD-10-CM | POA: Diagnosis not present

## 2020-02-16 DIAGNOSIS — Z853 Personal history of malignant neoplasm of breast: Secondary | ICD-10-CM | POA: Insufficient documentation

## 2020-02-16 DIAGNOSIS — Z880 Allergy status to penicillin: Secondary | ICD-10-CM | POA: Insufficient documentation

## 2020-02-16 DIAGNOSIS — Z803 Family history of malignant neoplasm of breast: Secondary | ICD-10-CM | POA: Diagnosis not present

## 2020-02-16 DIAGNOSIS — C419 Malignant neoplasm of bone and articular cartilage, unspecified: Secondary | ICD-10-CM | POA: Diagnosis not present

## 2020-02-16 DIAGNOSIS — Z923 Personal history of irradiation: Secondary | ICD-10-CM | POA: Insufficient documentation

## 2020-02-16 DIAGNOSIS — Z9221 Personal history of antineoplastic chemotherapy: Secondary | ICD-10-CM | POA: Insufficient documentation

## 2020-02-16 DIAGNOSIS — Z79811 Long term (current) use of aromatase inhibitors: Secondary | ICD-10-CM | POA: Insufficient documentation

## 2020-02-16 DIAGNOSIS — C50311 Malignant neoplasm of lower-inner quadrant of right female breast: Secondary | ICD-10-CM | POA: Diagnosis not present

## 2020-02-16 NOTE — Progress Notes (Signed)
[place]  Telephone:([phone]) [phone] Fax:(336) [phone]     ID: Rhonda Keller DOB: [birth year]  MR#: [id number]  EZM#:[id number]  Patient Care Team: Patient, No Pcp Per as PCP - General (General Practice) Rhonda Kaufmann, RN as Oncology Nurse Navigator Rhonda Germany, RN as Oncology Nurse Navigator Rhonda Luna, MD as Consulting Physician (General Surgery) Rhonda Keller, Rhonda Dad, MD as Consulting Physician (Oncology) Rhonda Rudd, MD as Consulting Physician (Radiation Oncology) Rhonda Miss, MD as Consulting Physician (Neurosurgery) Rhonda Charleston, MD as Consulting Physician (Obstetrics and Gynecology) Rhonda Cruel, MD OTHER MD: Rhonda Hatchet, MD [phone] opthamology)  I connected with Rhonda Keller on 02/16/20 at 11:00 AM EDT by telephone visit and verified that I am speaking with the correct person using two identifiers.   I discussed the limitations, risks, security and privacy concerns of performing an evaluation and management service by telemedicine and the availability of in-person appointments. I also discussed with the patient that there may be a patient responsible charge related to this service. The patient expressed understanding and agreed to proceed.   Other persons participating in the visit and their role in the encounter: none  Patient's location: home  Provider's location: [place]: estrogen receptor positive breast cancer  CURRENT TREATMENT:    INTERVAL HISTORY: Rhonda Keller was contacted today for follow up of her estrogen receptor positive breast cancer. She was evaluated in the multidisciplinary breast cancer clinic on 12/06/2019.   Since her last visit, she underwent lumbar spine MRI on 01/17/2020, which was largely stable.  She has also met with Dr. Ellene Route who told her he really could not do anything more as far as her leg function is concerned  We recommended bilateral mastectomies which is the standard  of care.  However she is very concerned regarding arm mobility issues and is considering lumpectomy.  She did obtain anastrozole 02/08/2020.  However she has not started it yet   REVIEW OF SYSTEMS: Rhonda Keller will be celebrating her 59th birthday next week!  She remains very worried that bilateral mastectomies may be more than she can handle.  She has no help at all at home she.  She is not very mobile.  She is afraid that she is going to lose function of her arms and then she really will be in terrible situation.  She is having a very hard time making a decision regarding this.   HISTORY OF CURRENT ILLNESS: From the original intake note:  Rhonda Keller has a history of bilateral breast cancers. Right breast cancer was diagnosed in 01/1998, invasive ductal carcinoma, node positive, reportedly estrogen and progesterone receptor negative and treated with lumpectomy, adjuvant radiation therapy, and 4 cycles of Taxol (Taxotere?) followed by 6 cycles of CMF. 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  The patient recalls taking tamoxifen for some time.  She also has a history of Ewing's carcinoma, which was diagnosed in 40 (age 2) and treated with surgery, radiation therapy, and chemotherapy in [place].  More recently she had routine screening mammography on 11/24/2019 showing a possible abnormality in the right breast. She underwent right diagnostic mammography with tomography and right breast ultrasonography at [facility] on 11/29/2019 showing: breast density category B; there was a 1.2 cm asymmetry in the lower-inner right breast without sonographic correlate; normal-appearing right axilla lymph nodes.  Accordingly on 11/30/2019 she proceeded to biopsy of the right breast  area in question. The pathology from this procedure ([id number]) showed: invasive and in situ mammary carcinoma, grade 2, e-cadherin positive. Prognostic indicators  significant for: estrogen receptor, >95% positive and progesterone receptor, >95% positive, both with strong staining intensity. Proliferation marker Ki67 at 10%. HER2 equivocal by immunohistochemistry (2+), but negative by fluorescent in situ hybridization with a signals ratio 1.46 and number per cell 1.90.  Her case was also presented at the multidisciplinary breast cancer conference on 12/06/2019. At that time a preliminary plan was proposed: Mastectomy or consideration of bilateral mastectomy, with no sentinel lymph node sampling think she must have had axillary lymph node dissection previously, likely no further radiation since she has already had radiation, but yes antiestrogens, and genetics.  The patient's subsequent history is as detailed below   PAST MEDICAL HISTORY: Past Medical History:  Diagnosis Date  . Breast cancer ([place])   . 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 Dysphagia, idiopathic 03/29/2012  . Ewing's sarcoma of bone [facility] 03/29/2012   Dec 1985 presenting with cord compression  Rx surgery, RT, chemo  . Personal history of chemotherapy   . Personal history of radiation therapy     PAST SURGICAL HISTORY: Past Surgical History:  Procedure Laterality Date  . BACK SURGERY    . BREAST BIOPSY    . BREAST LUMPECTOMY     bilateral   . BREAST SURGERY      FAMILY HISTORY: Family History  Problem Relation Age of Onset  . Breast cancer Mother    Patient's father is currently 77 years old and her mother is 27 years old (as of 12/05/19). On her father's side, there is no cancer to her knowoledge.  The patient's mother had breast cancer but Rhonda Keller is not sure at what age.  The patient has 1 brother and 0 sisters. There is no ovarian, pancreatic, or prostate cancer in the family to the patient's knowledge.   GYNECOLOGIC HISTORY:  No LMP recorded. Patient is postmenopausal. Menarche: 63-8 years old [place] P  0 LMP "years ago," she thinks early 25's Contraceptive used in her 42's without issue. HRT never used  Hysterectomy? no BSO? 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 She is single. She lives at home alone with her two dogs, a ShiTzu and a Morphy.     ADVANCED DIRECTIVES: In place-- She has named Fredrik Rigger [phone]) as her HCPOA   HEALTH MAINTENANCE: Social History   Tobacco Use  . Smoking status: Never Smoker  . Smokeless tobacco: Never Used  Substance Use Topics  . Alcohol use: Yes    Comment: every other week  . Drug use: No     Colonoscopy: 2018  PAP: 2019  Bone density: date unknown   Allergies  Allergen Reactions  . Penicillins Itching    Current Outpatient Medications  Medication Sig Dispense Refill  . anastrozole (ARIMIDEX) 1 MG tablet Take 1 tablet (1 mg total) by mouth daily. 30 tablet 6  . omeprazole (PRILOSEC) 20 MG capsule Take by mouth.     No current facility-administered medications for this visit.    OBJECTIVE: white woman who was tearful during today's conversation  There were no vitals filed for this visit.   There is no height or weight on file to calculate BMI.   Wt Readings from Last 3 Encounters:  12/06/19 224 lb  12.8 oz (102 kg)  11/13/13 203 lb (92.1 kg)  04/04/13 203 lb 11.2 oz (92.4 kg)      ECOG FS:2 - Symptomatic, <50% confined to bed  Televisit  LAB RESULTS:  CMP     Component Value Date/Time   NA 140 12/06/2019 1208   NA 143 03/28/2013 1238   K 4.2 12/06/2019 1208   K 4.3 03/28/2013 1238   CL 102 12/06/2019 1208   CO2 29 12/06/2019 1208   CO2 30 (H) 03/28/2013 1238   GLUCOSE 112 (H) 12/06/2019 1208   GLUCOSE 104 03/28/2013 1238   BUN 14 12/06/2019 1208   BUN 22.3 03/28/2013 1238   CREATININE 0.79 12/06/2019 1208   CREATININE 0.9 03/28/2013 1238   CALCIUM 9.5 12/06/2019 1208   CALCIUM 10.2 03/28/2013 1238   PROT 7.1 12/06/2019 1208   PROT 7.5 03/28/2013 1238    ALBUMIN 3.5 12/06/2019 1208   ALBUMIN 3.6 03/28/2013 1238   AST 14 (L) 12/06/2019 1208   AST 22 03/28/2013 1238   ALT 15 12/06/2019 1208   ALT 19 03/28/2013 1238   ALKPHOS 83 12/06/2019 1208   ALKPHOS 67 03/28/2013 1238   BILITOT 0.3 12/06/2019 1208   BILITOT 0.24 03/28/2013 1238   GFRNONAA >60 12/06/2019 1208   GFRAA >60 12/06/2019 1208    No results found for: TOTALPROTELP, ALBUMINELP, A1GS, A2GS, BETS, BETA2SER, GAMS, MSPIKE, SPEI  Lab Results  Component Value Date   WBC 7.0 12/06/2019   NEUTROABS 4.9 12/06/2019   HGB 11.5 (L) 12/06/2019   HCT 38.5 12/06/2019   MCV 80.2 12/06/2019   PLT 215 12/06/2019    No results found for: LABCA2  No components found for: QJJHER740  No results for input(s): INR in the last 168 hours.  No results found for: LABCA2  No results found for: CXK481  No results found for: EHU314  No results found for: HFW263  No results found for: CA2729  No components found for: HGQUANT  No results found for: CEA1 / No results found for: CEA1   No results found for: AFPTUMOR  No results found for: CHROMOGRNA  No results found for: KPAFRELGTCHN, LAMBDASER, KAPLAMBRATIO (kappa/lambda light chains)  No results found for: HGBA, HGBA2QUANT, HGBFQUANT, HGBSQUAN (Hemoglobinopathy evaluation)   Lab Results  Component Value Date   LDH 160 03/23/2012    No results found for: IRON, TIBC, IRONPCTSAT (Iron and TIBC)  No results found for: FERRITIN  Urinalysis No results found for: COLORURINE, APPEARANCEUR, LABSPEC, PHURINE, GLUCOSEU, HGBUR, BILIRUBINUR, KETONESUR, PROTEINUR, UROBILINOGEN, NITRITE, LEUKOCYTESUR   STUDIES: MR Lumbar Spine W Wo Contrast  Result Date: 01/18/2020 CLINICAL DATA:  59 year old female with back pain. Back and leg muscle weakness with numbness in the legs for 5 years. No known injury. Prior surgery. EXAM: MRI LUMBAR SPINE WITHOUT AND WITH CONTRAST TECHNIQUE: Multiplanar and multiecho pulse sequences of the lumbar  spine were obtained without and with intravenous contrast. CONTRAST:  46m MULTIHANCE GADOBENATE DIMEGLUMINE 529 MG/ML IV SOLN [facility] FINDINGS: Segmentation: Lumbar segmentation appears to be normal and is the same numbering system used on the 2017 MRI. Alignment: Stable lordosis. Chronic mild retrolisthesis at L1-L2 and L3-L4. Vertebrae: Chronic deformity of the L1 vertebral body, with sequelae of posterior decompression at that level. Generally mild degenerative endplate marrow signal changes elsewhere. No marrow edema or evidence of acute osseous abnormality. Intact visible sacrum and SI joints. Conus medullaris and cauda equina: Conus extends to the  T12-L1 level. No lower spinal cord or conus signal abnormality. Some of the ventral cauda equina nerve roots are chronically enhancing near the conus at T12-L1 as seen on series 8, image 3. This appears stable, and there is no nerve root thickening. No other abnormal intradural enhancement. No definite dural thickening. Paraspinal and other soft tissues: Postoperative changes to the posterior paraspinal soft tissues at the thoracolumbar junction. Stable visible kidneys with benign appearing bilateral renal cysts. Disc levels: No lower thoracic spinal stenosis. T12-L1: Chronic posterior decompression with mild to moderate residual posterior element hypertrophy. Granulation tissue suspected in the right T12 neural foramen. No definite stenosis. L1-L2: Chronic severe disc space loss with circumferential endplate spurring. Prior posterior decompression. Architectural distortion at the bilateral L1 neural foramina, with suspected mild to moderate chronic residual stenosis on the right. L2-L3: Negative disc. Moderate posterior element hypertrophy. Mild L2 foraminal stenosis greater on the right. L3-L4: Mild chronic retrolisthesis. Circumferential disc bulge with broad-based posterior  component and moderate facet, moderate to severe ligament flavum hypertrophy. Chronic degenerative facet joint fluid. Moderate to severe spinal stenosis and moderate bilateral lateral recess stenosis. Moderate bilateral L3 foraminal stenosis. This level has mildly progressed since 2017. L4-L5: Circumferential disc bulge with moderate posterior element hypertrophy and epidural lipomatosis. New posteriorly situated synovial cysts on the right at this level which should not cause neural compromise (series 5, image 24). Moderate spinal stenosis. No convincing lateral recess stenosis. Mild L4 foraminal stenosis. This level is stable. L5-S1: Epidural lipomatosis chronically effaces CSF from the thecal sac at this level. Negative disc, but moderate facet hypertrophy greater on the right. No stenosis. IMPRESSION: 1. Largely stable MRI appearance of the lumbar spine since 2017. Chronic postoperative changes at T12-L1 and L1-L2 with mild chronic enhancement of the ventral cauda equina at that level, presumably degenerative/inflammatory. Architectural distortion at the T12 and L1 neural foramina, with suspected moderate residual right L1 foraminal stenosis. 2. However, the L3-L4 level has mildly progressed since 2017 - where there is stable mild retrolisthesis and multifactorial moderate to severe spinal stenosis, moderate bilateral lateral recess and foraminal stenosis. 3. Stable moderate multifactorial spinal stenosis at L4-L5, in part related to epidural lipomatosis. Electronically Signed   By: Genevie Ann M.D.   On: 01/18/2020 08:01     ELIGIBLE FOR AVAILABLE RESEARCH PROTOCOL: AET  ASSESSMENT: 59 y.o. [place] woman status post right breast lower inner quadrant biopsy 11/30/2019 for a clinical T1c N0, stage IA invasive ductal carcinoma, grade 2, estrogen and progesterone receptor positive, with an MIB-1 of 10% and no HER-2 amplification.  (1) note in the history of present illness that the patient has had bilateral  lumpectomies and bilateral breast irradiation in the past as well as a history of chemotherapy for Ewing's sarcoma and for breast cancer.  (2) genetics testing  (3) definitive surgery pending  (4) to start anastrozole 02/16/2020   (a) she took tamoxifen for uncertain period of time in the past   PLAN:  Marleah is having a very difficult time deciding what to do regarding her breast cancer.  She understands that a lumpectomy alone, without radiation, means that she will have a higher risk of local recurrence than a mastectomy.  If she opts for that route we certainly could do intensified screening and obtain breast MRI yearly in addition to her mammogram.  However she strongly dislikes the idea of doing something every 6 months, which would cause a great deal of anxiety and also would be financially a burden.  We again discussed anastrozole and I urged her to start it.  This will give her a little bit more time to decide on surgery.  She understands that even if she has bilateral mastectomies she still needs to take anastrozole and even if she takes anastrozole she needs to have some kind of surgery.    That is as far as we got today.  I urged her to call Dr. Brantley Stage make an appointment and try to reach a decision regarding what is best for her at this point.  We will have another conversation in a month.  Rhonda Keller. Dejuana Weist, MD 02/16/2020 10:58 AM Medical Oncology and Hematology [facility] [place], [place] [address] Tel. [phone]    Fax. [phone]   This document serves as a record of services personally performed by Lurline Del, MD. It was created on his behalf by Wilburn Mylar, a trained medical scribe. The creation of this record is based on the scribe's personal observations and the provider's statements to them.   I, Lurline Del MD, have reviewed the above documentation for accuracy and completeness, and I agree with the  above.    *Total Encounter Time as defined by the Centers for Medicare and Medicaid Services includes, in addition to the face-to-face time of a patient visit (documented in the note above) non-face-to-face time: obtaining and reviewing outside history, ordering and reviewing medications, tests or procedures, care coordination (communications with other health care professionals or caregivers) and documentation in the medical record.

## 2020-02-19 ENCOUNTER — Telehealth: Payer: Self-pay | Admitting: Oncology

## 2020-02-19 NOTE — Telephone Encounter (Signed)
Scheduled per 5/21 los. Pt aware of phone visit on 6/18.

## 2020-03-14 NOTE — Progress Notes (Signed)
Rhonda Keller  [phone] [phone]     ID: Rhonda Keller DOB: 1961-06-02  MR#: [id number]  OZD#:[id number]  Patient Care Team: Patient, No Pcp Per as PCP - General (General Practice) Mauro Kaufmann, RN as Oncology Nurse Navigator Rockwell Germany, RN as Oncology Nurse Navigator Erroll Luna, MD as Consulting Physician (General Surgery) Remigio Mcmillon, Virgie Dad, MD as Consulting Physician (Oncology) Kyung Rudd, MD as Consulting Physician (Radiation Oncology) Kristeen Miss, MD as Consulting Physician (Neurosurgery) Bobbye Charleston, MD as Consulting Physician (Obstetrics and Gynecology) Chauncey Cruel, MD OTHER MD: Garlon Hatchet, MD [phone] opthamology)  I connected with Rhonda Keller on 03/15/20 at 10:00 AM EDT by telephone visit and verified that I am speaking with the correct person using two identifiers.   I discussed the limitations, risks, security and privacy concerns of performing an evaluation and management service by telemedicine and the availability of in-person appointments. I also discussed with the patient that there may be a patient responsible charge related to this service. The patient expressed understanding and agreed to proceed.   Other persons participating in the visit and their role in the encounter: none  Patient's location: home  Provider's location: [place]: estrogen receptor positive breast cancer  CURRENT TREATMENT: Anastrozole   INTERVAL HISTORY: Rhonda Keller was contacted today for follow up of her estrogen receptor positive breast cancer.   We recommended bilateral mastectomies which is the standard of care.  However she is very concerned regarding arm mobility issues and is considering lumpectomy or no surgery..  She started anastrozole on 02/16/2020.  So far she is tolerating it remarkably well.  She is having no problems with hot flashes or vaginal dryness.  She is having  severe mouth dryness from a different medication, a urologic drug that Dr. Claudia Desanctis prescribed for her.   REVIEW OF SYSTEMS: Rhonda Keller continues to feel extremely tired.  She really cannot walk.  She is very concerned that if she has surgery she will be totally incapacitated.  That really is the main barrier to her treatment.  A detailed review of systems today was otherwise stable.   HISTORY OF CURRENT ILLNESS: From the original intake note:  Rhonda Keller has a history of bilateral breast cancers. Right breast cancer was diagnosed in 01/1998, invasive ductal carcinoma, node positive, reportedly estrogen and progesterone receptor negative and treated with lumpectomy, adjuvant radiation therapy, and 4 cycles of Taxol (Taxotere?) followed by 6 cycles of CMF. 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  The patient recalls taking tamoxifen for some time.  She also has a history of Ewing's carcinoma, which was diagnosed in 32 (age 57) and treated with surgery, radiation therapy, and chemotherapy in [place].  More recently she had routine screening mammography on 11/24/2019 showing a possible abnormality in the right breast. She underwent right diagnostic mammography with tomography and right breast ultrasonography at [facility] on 11/29/2019 showing: breast density category B; there was a 1.2 cm asymmetry in the lower-inner right breast without sonographic correlate; normal-appearing right axilla lymph nodes.  Accordingly on 11/30/2019 she proceeded to biopsy of the right breast area in question. The pathology from this procedure ([id number]) showed: invasive and in situ mammary carcinoma, grade 2, e-cadherin positive. Prognostic indicators significant for: estrogen receptor, >95% positive and progesterone receptor, >95% positive, both with strong staining intensity. Proliferation marker Ki67 at 10%. HER2 equivocal by immunohistochemistry  (  2+), but negative by fluorescent in situ hybridization with a signals ratio 1.46 and number per cell 1.90.  Her case was also presented at the multidisciplinary breast cancer conference on 12/06/2019. At that time a preliminary plan was proposed: Mastectomy or consideration of bilateral mastectomy, with no sentinel lymph node sampling think she must have had axillary lymph node dissection previously, likely no further radiation since she has already had radiation, but yes antiestrogens, and genetics.  The patient's subsequent history is as detailed below   PAST MEDICAL HISTORY: Past Medical History:  Diagnosis Date  . Breast cancer ([place])   . 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 Dysphagia, idiopathic 03/29/2012  . Ewing's sarcoma of bone [facility]) 03/29/2012   Dec 1985 presenting with cord compression  Rx surgery, RT, chemo  . Personal history of chemotherapy   . Personal history of radiation therapy     PAST SURGICAL HISTORY: Past Surgical History:  Procedure Laterality Date  . BACK SURGERY    . BREAST BIOPSY    . BREAST LUMPECTOMY     bilateral   . BREAST SURGERY      FAMILY HISTORY: Family History  Problem Relation Age of Onset  . Breast cancer Mother    Patient's father is currently 16 years old and her mother is 36 years old (as of 12/05/19). On her father's side, there is no cancer to her knowoledge.  The patient's mother had breast cancer but Ms. Rhonda Keller is not sure at what age.  The patient has 1 brother and 0 sisters. There is no ovarian, pancreatic, or prostate cancer in the family to the patient's knowledge.   GYNECOLOGIC HISTORY:  No LMP recorded. Patient is postmenopausal. Menarche: 86-69 years old [place] P 0 LMP "years ago," she thinks early 74's Contraceptive used in her 30's without issue. HRT never used  Hysterectomy? no BSO? 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 She is single. She lives at home alone with her two dogs, a ShiTzu and a Morphy.     ADVANCED DIRECTIVES: In place-- She has named Rhonda Keller [phone]) as her HCPOA   HEALTH MAINTENANCE: Social History   Tobacco Use  . Smoking status: Never Smoker  . Smokeless tobacco: Never Used  Substance Use Topics  . Alcohol use: Yes    Comment: every other week  . Drug use: No     Colonoscopy: 2018  PAP: 2019  Bone density: date unknown   Allergies  Allergen Reactions  . Penicillins Itching    Current Outpatient Medications  Medication Sig Dispense Refill  . anastrozole (ARIMIDEX) 1 MG tablet Take 1 tablet (1 mg total) by mouth daily. 30 tablet 6  . omeprazole (PRILOSEC) 20 MG capsule Take by mouth.     No current facility-administered medications for this visit.    OBJECTIVE: white woman   There were no vitals filed for this visit.   There is no height or weight on file to calculate BMI.   Wt Readings from Last 3 Encounters:  12/06/19 224 lb 12.8 oz (102 kg)  11/13/13 203 lb (92.1 kg)  04/04/13 203 lb 11.2 oz (92.4 kg)      ECOG FS:2 - Symptomatic, <50% confined to bed  Televisit  LAB RESULTS:  CMP     Component Value Date/Time   NA 140 12/06/2019 1208  NA 143 03/28/2013 1238   K 4.2 12/06/2019 1208   K 4.3 03/28/2013 1238   CL 102 12/06/2019 1208   CO2 29 12/06/2019 1208   CO2 30 (H) 03/28/2013 1238   GLUCOSE 112 (H) 12/06/2019 1208   GLUCOSE 104 03/28/2013 1238   BUN 14 12/06/2019 1208   BUN 22.3 03/28/2013 1238   CREATININE 0.79 12/06/2019 1208   CREATININE 0.9 03/28/2013 1238   CALCIUM 9.5 12/06/2019 1208   CALCIUM 10.2 03/28/2013 1238   PROT 7.1 12/06/2019 1208   PROT 7.5 03/28/2013 1238   ALBUMIN 3.5 12/06/2019 1208   ALBUMIN 3.6 03/28/2013 1238   AST 14 (L) 12/06/2019 1208   AST 22 03/28/2013 1238   ALT 15 12/06/2019 1208   ALT 19 03/28/2013 1238   ALKPHOS 83 12/06/2019 1208   ALKPHOS 67 03/28/2013 1238   BILITOT 0.3  12/06/2019 1208   BILITOT 0.24 03/28/2013 1238   GFRNONAA >60 12/06/2019 1208   GFRAA >60 12/06/2019 1208    No results found for: TOTALPROTELP, ALBUMINELP, A1GS, A2GS, BETS, BETA2SER, GAMS, MSPIKE, SPEI  Lab Results  Component Value Date   WBC 7.0 12/06/2019   NEUTROABS 4.9 12/06/2019   HGB 11.5 (L) 12/06/2019   HCT 38.5 12/06/2019   MCV 80.2 12/06/2019   PLT 215 12/06/2019    No results found for: LABCA2  No components found for: ZJQBHA193  No results for input(s): INR in the last 168 hours.  No results found for: LABCA2  No results found for: XTK240  No results found for: XBD532  No results found for: DJM426  No results found for: CA2729  No components found for: HGQUANT  No results found for: CEA1 / No results found for: CEA1   No results found for: AFPTUMOR  No results found for: CHROMOGRNA  No results found for: KPAFRELGTCHN, LAMBDASER, KAPLAMBRATIO (kappa/lambda light chains)  No results found for: HGBA, HGBA2QUANT, HGBFQUANT, HGBSQUAN (Hemoglobinopathy evaluation)   Lab Results  Component Value Date   LDH 160 03/23/2012    No results found for: IRON, TIBC, IRONPCTSAT (Iron and TIBC)  No results found for: FERRITIN  Urinalysis No results found for: COLORURINE, APPEARANCEUR, LABSPEC, PHURINE, GLUCOSEU, HGBUR, BILIRUBINUR, KETONESUR, PROTEINUR, UROBILINOGEN, NITRITE, LEUKOCYTESUR   STUDIES: No results found.   ELIGIBLE FOR AVAILABLE RESEARCH PROTOCOL: AET  ASSESSMENT: 59 y.o. [place] woman status post right breast lower inner quadrant biopsy 11/30/2019 for a clinical T1c N0, stage IA invasive ductal carcinoma, grade 2, estrogen and progesterone receptor positive, with an MIB-1 of 10% and no HER-2 amplification.  (1) note in the history of present illness that the patient has had bilateral lumpectomies and bilateral breast irradiation in the past as well as a history of chemotherapy for Ewing's sarcoma and for breast cancer.  (2) genetics  testing  (3) definitive surgery pending  (4) started anastrozole 02/16/2020   (a) she took tamoxifen for uncertain period of time in the past   PLAN:  Tanara is tolerating anastrozole well.  This gives [place] a little time for her to think about surgery.  I think she should proceed with bilateral mastectomies.  She probably will need to go to a rehab facility for 2 to 4 weeks after that but I think that can be arranged beforehand.  She does need to discuss all this with her surgeon Dr. Brantley Stage at some point.  Right now we are going to schedule her for a repeat right mammogram and ultrasound in August.  I will see her shortly  after that.  At that point again we can discuss the surgery issue.  She knows to call for any other issue that may develop before the next visit.   Virgie Dad. Merl Guardino, MD 03/15/2020 11:06 AM Medical Oncology and Hematology [facility] [place], [place] [address] Tel. [phone]    Fax. [phone]   This document serves as a record of services personally performed by Lurline Del, MD. It was created on his behalf by Wilburn Mylar, a trained medical scribe. The creation of this record is based on the scribe's personal observations and the provider's statements to them.   I, Lurline Del MD, have reviewed the above documentation for accuracy and completeness, and I agree with the above.   *Total Encounter Time as defined by the Centers for Medicare and Medicaid Services includes, in addition to the face-to-face time of a patient visit (documented in the note above) non-face-to-face time: obtaining and reviewing outside history, ordering and reviewing medications, tests or procedures, care coordination (communications with other health care professionals or caregivers) and documentation in the medical record.

## 2020-03-15 ENCOUNTER — Inpatient Hospital Stay: Payer: 59 | Attending: Oncology | Admitting: Oncology

## 2020-03-15 ENCOUNTER — Other Ambulatory Visit: Payer: Self-pay

## 2020-03-15 DIAGNOSIS — Z79811 Long term (current) use of aromatase inhibitors: Secondary | ICD-10-CM | POA: Insufficient documentation

## 2020-03-15 DIAGNOSIS — Z803 Family history of malignant neoplasm of breast: Secondary | ICD-10-CM | POA: Insufficient documentation

## 2020-03-15 DIAGNOSIS — C50311 Malignant neoplasm of lower-inner quadrant of right female breast: Secondary | ICD-10-CM

## 2020-03-15 DIAGNOSIS — Z923 Personal history of irradiation: Secondary | ICD-10-CM | POA: Insufficient documentation

## 2020-03-15 DIAGNOSIS — Z880 Allergy status to penicillin: Secondary | ICD-10-CM | POA: Insufficient documentation

## 2020-03-15 DIAGNOSIS — R682 Dry mouth, unspecified: Secondary | ICD-10-CM | POA: Insufficient documentation

## 2020-03-15 DIAGNOSIS — Z170 Estrogen receptor positive status [ER+]: Secondary | ICD-10-CM | POA: Diagnosis not present

## 2020-03-15 DIAGNOSIS — Z79899 Other long term (current) drug therapy: Secondary | ICD-10-CM | POA: Insufficient documentation

## 2020-03-15 DIAGNOSIS — Z9221 Personal history of antineoplastic chemotherapy: Secondary | ICD-10-CM | POA: Insufficient documentation

## 2020-03-15 MED ORDER — ANASTROZOLE 1 MG PO TABS: 1.0000 mg | ORAL_TABLET | Freq: Every day | ORAL | 4 refills | Status: DC

## 2020-03-18 ENCOUNTER — Telehealth: Payer: Self-pay | Admitting: Oncology

## 2020-03-18 NOTE — Telephone Encounter (Signed)
Scheduled appts per 6/18 los. Pt's voicemail box was full. Mailed appt reminder and calendar.

## 2020-04-17 ENCOUNTER — Telehealth: Payer: Self-pay | Admitting: *Deleted

## 2020-04-17 NOTE — Telephone Encounter (Signed)
Pt called stating she has stopped taking the anastrozole d/t severe hot flashes. Left vm informing pt to hold anastrozole x2wks to see if the hot flashes become better and to call with results of holding. Contact information provided for return call.

## 2020-05-16 ENCOUNTER — Other Ambulatory Visit: Payer: 59

## 2020-05-16 ENCOUNTER — Ambulatory Visit: Payer: 59 | Admitting: Oncology

## 2020-05-16 ENCOUNTER — Encounter: Payer: 59 | Admitting: Genetic Counselor

## 2020-06-18 ENCOUNTER — Telehealth: Payer: Self-pay | Admitting: *Deleted

## 2020-06-18 NOTE — Telephone Encounter (Signed)
Pt called and is ready to try another AI. Called pt and left vm informing we can try letrozole and to confirm pharmacy she would like the prescription sent to. Contact information provided for return call.

## 2020-06-20 ENCOUNTER — Telehealth: Payer: Self-pay | Admitting: *Deleted

## 2020-06-20 MED ORDER — LETROZOLE 2.5 MG PO TABS: 2.5000 mg | ORAL_TABLET | Freq: Every day | ORAL | 6 refills | Status: DC

## 2020-06-20 NOTE — Telephone Encounter (Signed)
Pt return call and confirmed pharmacy to send in letrozole. Pt with questions regarding billing. Provided number to [facility] for assistance.

## 2020-07-02 ENCOUNTER — Encounter: Payer: Self-pay | Admitting: *Deleted

## 2020-07-02 ENCOUNTER — Telehealth: Payer: Self-pay | Admitting: *Deleted

## 2020-07-02 NOTE — Telephone Encounter (Signed)
Spoke to pt regarding plans for sx. Pt would like to move forward with mastectomy mid November if possible.  Physician team notified of pt request. No further questions or needs voiced at this time.

## 2020-07-29 ENCOUNTER — Ambulatory Visit: Payer: Self-pay | Admitting: Surgery

## 2020-07-29 DIAGNOSIS — Z853 Personal history of malignant neoplasm of breast: Secondary | ICD-10-CM

## 2020-07-29 NOTE — H&P (Signed)
Cathie Beams Appointment: 07/29/2020 11:30 AM Location: [facility] Patient #: [id number] DOB: 1960-12-23 Single / Language: Rhonda Keller / Race: White Female  History of Present Illness Marcello Moores A. Keiland Pickering MD; 07/29/2020 12:06 PM) Patient words: Patient returns for follow-up of her right breast cancer. She seen earlier this year placed on anti-estrogens. 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 She's having more pain and numbness and weakness of bilateral upper extremities. She's been evaluated by neurosurgery who recommends fusion with decompression. She's quite symptomatic today from that she states. She did not tolerate anti-estrogens due to hot flashes despite being switched over multiple occasions. She stopped taking her anti-estrogens about 2 months ago due to hot flashes. I've asked her to restart those it able bias more time. She understands that she is at a higher risk of local regional recurrence and systemic disease without them.  The patient is a 59 year old female.   Allergies Sabino Gasser, CMA; 07/29/2020 11:16 AM) Penicillins Itching. Allergies Reconciled  Medication History Sabino Gasser, CMA; 07/29/2020 11:16 AM) Omeprazole (40MG  Capsule DR, Oral daily) Active. Medications Reconciled    Vitals Sabino Gasser CMA; 07/29/2020 11:19 AM) 07/29/2020 11:18 AM Weight: 235.4 lb Height: 62in Body Surface Area: 2.05 m Body Mass Index: 43.05 kg/m  Temp.: 58F(Tympanic)  Pulse: 84 (Regular)  BP: 130/82(Sitting, Left Arm, Standard)        Physical Exam (Pankaj Haack A. Churchill Grimsley MD; 07/29/2020 12:07 PM)  General Mental Status-Alert. General Appearance-Consistent with stated age. Hydration-Well hydrated. Voice-Normal.  Breast Breast - Left-Symmetric, Non Tender, No Biopsy scars, no Dimpling - Left, No Inflammation, No Lumpectomy scars, No Mastectomy  scars, No Peau d' Orange. Breast - Right-Symmetric, Non Tender, No Biopsy scars, no Dimpling - Right, No Inflammation, No Lumpectomy scars, No Mastectomy scars, No Peau d' Orange. Breast Lump-No Palpable Breast Mass.  Neurologic Note: Mild bilateral upper extremity weakness to grip strength noted. Patient complains of bilateral procedures which are mild episodic.  Lymphatic Head & Neck  General Head & Neck Lymphatics: Bilateral - Description - Normal. Axillary  General Axillary Region: Bilateral - Description - Normal. Tenderness - Non Tender.    Assessment & Plan (Margarit Minshall A. Christyna Letendre MD; 07/29/2020 12:08 PM)  BREAST CANCER, STAGE 1, RIGHT (C50.911) Impression: Discussed right simple mastectomy with sentinel lymph node mapping if able. Success rates for mapping R lower given previous surgery and radiation. I've encouraged her to try to restart her anti-estrogens right now since she will need to have her neck evaluated and treated due to her symptoms and possible loss of upper extremity function from those symptoms. She walks with a walker inferior dependent on her upper body to help support her lower body. I discussed the pros and cons of this approach versus doing the mastectomy first and subsequent decompression later. I think if she had less symptoms she can get decompression and later time but I feel this is a more pressing issue currently and she should discuss this with her neurosurgeon. Surgery could be done a month later once she has decompression of the cervical spine with resolution of her symptoms. She will discuss getting back on anti-estrogens and I encouraged her to do so for better potential outcome. Risk of vasectomy including bleeding, infection, flap necrosis, deep vein thrombosis, death, right upper extremity lymphedema, recurrence, decreased range of motion and pain.  Current Plans Pt Education - [facility] Breast Cancer Information Given - [facility] "Breast Journey" Package Pt  Education - [facility] Mastectomy HCI Pt Education - [facility] Mastectomy HCI

## 2020-07-30 ENCOUNTER — Encounter: Payer: Self-pay | Admitting: *Deleted

## 2020-08-05 ENCOUNTER — Other Ambulatory Visit: Payer: Self-pay | Admitting: Neurological Surgery

## 2020-08-05 DIAGNOSIS — R29898 Other symptoms and signs involving the musculoskeletal system: Secondary | ICD-10-CM

## 2020-08-16 ENCOUNTER — Encounter: Payer: Self-pay | Admitting: *Deleted

## 2020-08-31 ENCOUNTER — Ambulatory Visit
Admission: RE | Admit: 2020-08-31 | Discharge: 2020-08-31 | Disposition: A | Discharge status: Home / Self Care | Payer: 59 | Source: Ambulatory Visit | Attending: Neurological Surgery | Admitting: Neurological Surgery

## 2020-08-31 ENCOUNTER — Other Ambulatory Visit: Payer: Self-pay

## 2020-08-31 DIAGNOSIS — R29898 Other symptoms and signs involving the musculoskeletal system: Secondary | ICD-10-CM

## 2020-10-04 ENCOUNTER — Encounter: Payer: Self-pay | Admitting: *Deleted

## 2020-10-09 ENCOUNTER — Encounter: Payer: Self-pay | Admitting: *Deleted

## 2020-10-10 ENCOUNTER — Encounter: Payer: Self-pay | Admitting: *Deleted

## 2020-10-30 ENCOUNTER — Encounter: Payer: Self-pay | Admitting: *Deleted

## 2020-11-06 ENCOUNTER — Encounter: Payer: Self-pay | Admitting: *Deleted

## 2020-11-06 ENCOUNTER — Telehealth: Payer: Self-pay | Admitting: Oncology

## 2020-11-06 NOTE — Telephone Encounter (Signed)
Scheduled appt per 2/9 sch msg - unable to reach pt . Mailed letter with apt date and time

## 2020-11-12 ENCOUNTER — Encounter (HOSPITAL_BASED_OUTPATIENT_CLINIC_OR_DEPARTMENT_OTHER): Payer: Self-pay | Admitting: Surgery

## 2020-11-14 ENCOUNTER — Ambulatory Visit: Payer: Self-pay | Admitting: Surgery

## 2020-11-15 ENCOUNTER — Other Ambulatory Visit (HOSPITAL_COMMUNITY)
Admission: RE | Admit: 2020-11-15 | Discharge: 2020-11-15 | Disposition: A | Discharge status: Home / Self Care | Payer: 59 | Source: Ambulatory Visit | Attending: Surgery | Admitting: Surgery

## 2020-11-15 ENCOUNTER — Telehealth: Payer: Self-pay | Admitting: *Deleted

## 2020-11-15 DIAGNOSIS — Z01812 Encounter for preprocedural laboratory examination: Secondary | ICD-10-CM | POA: Diagnosis not present

## 2020-11-15 DIAGNOSIS — Z20822 Contact with and (suspected) exposure to covid-19: Secondary | ICD-10-CM | POA: Diagnosis not present

## 2020-11-15 LAB — SARS CORONAVIRUS 2 (TAT 6-24 HRS): SARS Coronavirus 2: NEGATIVE

## 2020-11-15 NOTE — Telephone Encounter (Signed)
Spoke with patient to let her know that the appt on 3/8 is a post op visit. She verbalized understanding.

## 2020-11-18 NOTE — Progress Notes (Signed)
Left message for patient to come in for labwork today.

## 2020-11-19 ENCOUNTER — Other Ambulatory Visit: Payer: Self-pay

## 2020-11-19 ENCOUNTER — Ambulatory Visit (HOSPITAL_BASED_OUTPATIENT_CLINIC_OR_DEPARTMENT_OTHER): Payer: 59 | Admitting: Anesthesiology

## 2020-11-19 ENCOUNTER — Encounter (HOSPITAL_BASED_OUTPATIENT_CLINIC_OR_DEPARTMENT_OTHER): Payer: Self-pay | Admitting: Surgery

## 2020-11-19 ENCOUNTER — Encounter (HOSPITAL_BASED_OUTPATIENT_CLINIC_OR_DEPARTMENT_OTHER)
Admission: RE | Disposition: A | Discharge status: Home / Self Care | Payer: Self-pay | Source: Home / Self Care | Attending: Surgery

## 2020-11-19 ENCOUNTER — Ambulatory Visit (HOSPITAL_BASED_OUTPATIENT_CLINIC_OR_DEPARTMENT_OTHER)
Admission: RE | Admit: 2020-11-19 | Discharge: 2020-11-20 | Disposition: A | Discharge status: Home / Self Care | Payer: 59 | Attending: Surgery | Admitting: Surgery

## 2020-11-19 DIAGNOSIS — Z923 Personal history of irradiation: Secondary | ICD-10-CM | POA: Diagnosis not present

## 2020-11-19 DIAGNOSIS — C50911 Malignant neoplasm of unspecified site of right female breast: Secondary | ICD-10-CM | POA: Diagnosis present

## 2020-11-19 DIAGNOSIS — Z79899 Other long term (current) drug therapy: Secondary | ICD-10-CM | POA: Diagnosis not present

## 2020-11-19 DIAGNOSIS — Z880 Allergy status to penicillin: Secondary | ICD-10-CM | POA: Diagnosis not present

## 2020-11-19 DIAGNOSIS — Z85830 Personal history of malignant neoplasm of bone: Secondary | ICD-10-CM | POA: Diagnosis not present

## 2020-11-19 DIAGNOSIS — Z86000 Personal history of in-situ neoplasm of breast: Secondary | ICD-10-CM | POA: Diagnosis not present

## 2020-11-19 HISTORY — PX: TOTAL MASTECTOMY: SHX6129

## 2020-11-19 LAB — COMPREHENSIVE METABOLIC PANEL
ALT: 14 U/L (ref 0–44)
AST: 19 U/L (ref 15–41)
Albumin: 3.5 g/dL (ref 3.5–5.0)
Alkaline Phosphatase: 71 U/L (ref 38–126)
Anion gap: 10 (ref 5–15)
BUN: 15 mg/dL (ref 6–20)
CO2: 25 mmol/L (ref 22–32)
Calcium: 9.8 mg/dL (ref 8.9–10.3)
Chloride: 103 mmol/L (ref 98–111)
Creatinine, Ser: 0.85 mg/dL (ref 0.44–1.00)
GFR, Estimated: >60 mL/min (ref >60)
Glucose, Bld: 163 mg/dL — ABNORMAL HIGH (ref 70–99)
Potassium: 3.9 mmol/L (ref 3.5–5.1)
Sodium: 138 mmol/L (ref 135–145)
Total Bilirubin: 0.4 mg/dL (ref 0.3–1.2)
Total Protein: 6.8 g/dL (ref 6.5–8.1)

## 2020-11-19 LAB — CBC WITH DIFFERENTIAL/PLATELET
Abs Immature Granulocytes: 0.03 10*3/uL (ref 0.00–0.07)
Basophils Absolute: 0 10*3/uL (ref 0.0–0.1)
Basophils Relative: 0 %
Eosinophils Absolute: 0.2 10*3/uL (ref 0.0–0.5)
Eosinophils Relative: 3 %
HCT: 37 % (ref 36.0–46.0)
Hemoglobin: 10.9 g/dL — ABNORMAL LOW (ref 12.0–15.0)
Immature Granulocytes: 0 %
Lymphocytes Relative: 21 %
Lymphs Abs: 1.7 10*3/uL (ref 0.7–4.0)
MCH: 22.5 pg — ABNORMAL LOW (ref 26.0–34.0)
MCHC: 29.5 g/dL — ABNORMAL LOW (ref 30.0–36.0)
MCV: 76.4 fL — ABNORMAL LOW (ref 80.0–100.0)
Monocytes Absolute: 0.7 10*3/uL (ref 0.1–1.0)
Monocytes Relative: 8 %
Neutro Abs: 5.3 10*3/uL (ref 1.7–7.7)
Neutrophils Relative %: 68 %
Platelets: 203 10*3/uL (ref 150–400)
RBC: 4.84 MIL/uL (ref 3.87–5.11)
RDW: 15.3 % (ref 11.5–15.5)
WBC: 7.9 10*3/uL (ref 4.0–10.5)
nRBC: 0 % (ref 0.0–0.2)

## 2020-11-19 SURGERY — MASTECTOMY, SIMPLE
Anesthesia: Regional | Site: Breast | Laterality: Bilateral

## 2020-11-19 MED ORDER — SODIUM CHLORIDE 0.9 % IV SOLN
INTRAVENOUS | Status: DC | PRN
  Administered 2020-11-19: 500 mL

## 2020-11-19 MED ORDER — SODIUM CHLORIDE 0.9 % IV SOLN
250.0000 mL | INTRAVENOUS | Status: DC | PRN
Start: 2020-11-19 — End: 2020-11-20

## 2020-11-19 MED ORDER — DEXAMETHASONE SODIUM PHOSPHATE 4 MG/ML IJ SOLN
INTRAMUSCULAR | Status: DC | PRN
  Administered 2020-11-19: 10 mg via INTRAVENOUS

## 2020-11-19 MED ORDER — METHOCARBAMOL 500 MG PO TABS
500.0000 mg | ORAL_TABLET | Freq: Four times a day (QID) | ORAL | Status: DC | PRN
  Administered 2020-11-19 (×2): 500 mg via ORAL
  Filled 2020-11-19 (×2): qty 1

## 2020-11-19 MED ORDER — OXYCODONE HCL 5 MG PO TABS
5.0000 mg | ORAL_TABLET | ORAL | Status: DC | PRN
  Administered 2020-11-19 (×2): 5 mg via ORAL
  Filled 2020-11-19 (×2): qty 1

## 2020-11-19 MED ORDER — IBUPROFEN 800 MG PO TABS: 800.0000 mg | ORAL_TABLET | Freq: Three times a day (TID) | ORAL | 0 refills | Status: DC | PRN

## 2020-11-19 MED ORDER — DEXTROSE-NACL 5-0.9 % IV SOLN: INTRAVENOUS | Status: DC

## 2020-11-19 MED ORDER — SODIUM CHLORIDE 0.9 % IV SOLN
INTRAVENOUS | Status: AC
  Filled 2020-11-19: qty 10

## 2020-11-19 MED ORDER — MORPHINE SULFATE (PF) 4 MG/ML IV SOLN: 1.0000 mg | INTRAVENOUS | Status: DC | PRN

## 2020-11-19 MED ORDER — ONDANSETRON HCL 4 MG/2ML IJ SOLN
INTRAMUSCULAR | Status: DC | PRN
  Administered 2020-11-19: 4 mg via INTRAVENOUS

## 2020-11-19 MED ORDER — HYDROCODONE-ACETAMINOPHEN 5-325 MG PO TABS: 1.0000 | ORAL_TABLET | Freq: Four times a day (QID) | ORAL | 0 refills | Status: DC | PRN

## 2020-11-19 MED ORDER — SODIUM CHLORIDE 0.9 % IV SOLN
INTRAVENOUS | Status: DC | PRN
  Administered 2020-11-19 (×2): 40 ug via INTRAVENOUS
  Administered 2020-11-19 (×2): 120 ug via INTRAVENOUS

## 2020-11-19 MED ORDER — LACTATED RINGERS IV SOLN: INTRAVENOUS | Status: DC

## 2020-11-19 MED ORDER — SUGAMMADEX SODIUM 200 MG/2ML IV SOLN
INTRAVENOUS | Status: DC | PRN
  Administered 2020-11-19: 200 mg via INTRAVENOUS

## 2020-11-19 MED ORDER — FENTANYL CITRATE (PF) 100 MCG/2ML IJ SOLN
100.0000 ug | Freq: Once | INTRAMUSCULAR | Status: AC
Start: 2020-11-19 — End: 2020-11-19
  Administered 2020-11-19: 50 ug via INTRAVENOUS

## 2020-11-19 MED ORDER — FENTANYL CITRATE (PF) 100 MCG/2ML IJ SOLN
INTRAMUSCULAR | Status: AC
  Filled 2020-11-19: qty 2

## 2020-11-19 MED ORDER — PROPOFOL 10 MG/ML IV BOLUS
INTRAVENOUS | Status: DC | PRN
  Administered 2020-11-19: 200 mg via INTRAVENOUS

## 2020-11-19 MED ORDER — MIDAZOLAM HCL 2 MG/2ML IJ SOLN
2.0000 mg | Freq: Once | INTRAMUSCULAR | Status: AC
  Administered 2020-11-19: 2 mg via INTRAVENOUS

## 2020-11-19 MED ORDER — SODIUM CHLORIDE 0.9% FLUSH
3.0000 mL | Freq: Two times a day (BID) | INTRAVENOUS | Status: DC
  Administered 2020-11-19: 3 mL via INTRAVENOUS

## 2020-11-19 MED ORDER — MIDAZOLAM HCL 2 MG/2ML IJ SOLN
INTRAMUSCULAR | Status: AC
  Filled 2020-11-19: qty 2

## 2020-11-19 MED ORDER — ACETAMINOPHEN 325 MG PO TABS: 650.0000 mg | ORAL_TABLET | ORAL | Status: DC | PRN

## 2020-11-19 MED ORDER — ROCURONIUM BROMIDE 100 MG/10ML IV SOLN
INTRAVENOUS | Status: DC | PRN
  Administered 2020-11-19: 60 mg via INTRAVENOUS

## 2020-11-19 MED ORDER — CHLORHEXIDINE GLUCONATE CLOTH 2 % EX PADS: 6.0000 | MEDICATED_PAD | Freq: Once | CUTANEOUS | Status: DC

## 2020-11-19 MED ORDER — SODIUM CHLORIDE 0.9% FLUSH: 3.0000 mL | INTRAVENOUS | Status: DC | PRN

## 2020-11-19 MED ORDER — ONDANSETRON HCL 4 MG/2ML IJ SOLN: 4.0000 mg | INTRAMUSCULAR | Status: DC | PRN

## 2020-11-19 MED ORDER — ACETAMINOPHEN 325 MG RE SUPP: 650.0000 mg | RECTAL | Status: DC | PRN

## 2020-11-19 MED ORDER — CLINDAMYCIN PHOSPHATE 900 MG/50ML IV SOLN
INTRAVENOUS | Status: AC
  Filled 2020-11-19: qty 50

## 2020-11-19 MED ORDER — EPHEDRINE SULFATE 50 MG/ML IJ SOLN
INTRAMUSCULAR | Status: DC | PRN
  Administered 2020-11-19: 10 mg via INTRAVENOUS

## 2020-11-19 MED ORDER — BUPIVACAINE HCL (PF) 0.25 % IJ SOLN
INTRAMUSCULAR | Status: DC | PRN
  Administered 2020-11-19 (×2): 30 mL

## 2020-11-19 MED ORDER — LIDOCAINE HCL (CARDIAC) PF 100 MG/5ML IV SOSY
PREFILLED_SYRINGE | INTRAVENOUS | Status: DC | PRN
  Administered 2020-11-19: 60 mg via INTRAVENOUS

## 2020-11-19 MED ORDER — FENTANYL CITRATE (PF) 100 MCG/2ML IJ SOLN
INTRAMUSCULAR | Status: DC | PRN
  Administered 2020-11-19: 100 ug via INTRAVENOUS
  Administered 2020-11-19: 50 ug via INTRAVENOUS

## 2020-11-19 MED ORDER — CLINDAMYCIN PHOSPHATE 900 MG/50ML IV SOLN
900.0000 mg | INTRAVENOUS | Status: AC
  Administered 2020-11-19: 900 mg via INTRAVENOUS

## 2020-11-19 SURGICAL SUPPLY — 69 items
APPLIER CLIP 11 MED OPEN (CLIP)
APPLIER CLIP 9.375 MED OPEN (MISCELLANEOUS) ×2
BAG DECANTER FOR FLEXI CONT (MISCELLANEOUS) ×4 IMPLANT
BENZOIN TINCTURE PRP APPL 2/3 (GAUZE/BANDAGES/DRESSINGS) IMPLANT
BINDER BREAST LRG (GAUZE/BANDAGES/DRESSINGS) IMPLANT
BINDER BREAST MEDIUM (GAUZE/BANDAGES/DRESSINGS) IMPLANT
BINDER BREAST XLRG (GAUZE/BANDAGES/DRESSINGS) IMPLANT
BINDER BREAST XXLRG (GAUZE/BANDAGES/DRESSINGS) ×2 IMPLANT
BIOPATCH RED 1 DISK 7.0 (GAUZE/BANDAGES/DRESSINGS) ×4 IMPLANT
BLADE HEX COATED 2.75 (ELECTRODE) ×2 IMPLANT
BLADE SURG 10 STRL SS (BLADE) ×4 IMPLANT
BLADE SURG 15 STRL LF DISP TIS (BLADE) ×1 IMPLANT
BLADE SURG 15 STRL SS (BLADE) ×2
BNDG ELASTIC 6X5.8 VLCR STR LF (GAUZE/BANDAGES/DRESSINGS) ×2 IMPLANT
CANISTER SUCT 1200ML W/VALVE (MISCELLANEOUS) ×2 IMPLANT
CHLORAPREP W/TINT 26 (MISCELLANEOUS) ×4 IMPLANT
CLIP APPLIE 11 MED OPEN (CLIP) IMPLANT
CLIP APPLIE 9.375 MED OPEN (MISCELLANEOUS) ×1 IMPLANT
COVER BACK TABLE 60X90IN (DRAPES) ×2 IMPLANT
COVER MAYO STAND STRL (DRAPES) ×2 IMPLANT
COVER PROBE W GEL 5X96 (DRAPES) ×2 IMPLANT
COVER WAND RF STERILE (DRAPES) IMPLANT
DECANTER SPIKE VIAL GLASS SM (MISCELLANEOUS) IMPLANT
DERMABOND ADVANCED (GAUZE/BANDAGES/DRESSINGS) ×4
DERMABOND ADVANCED .7 DNX12 (GAUZE/BANDAGES/DRESSINGS) ×4 IMPLANT
DRAIN CHANNEL 19F RND (DRAIN) ×4 IMPLANT
DRAPE LAPAROSCOPIC ABDOMINAL (DRAPES) ×2 IMPLANT
DRAPE UTILITY XL STRL (DRAPES) ×2 IMPLANT
DRSG PAD ABDOMINAL 8X10 ST (GAUZE/BANDAGES/DRESSINGS) ×4 IMPLANT
DRSG TEGADERM 4X10 (GAUZE/BANDAGES/DRESSINGS) IMPLANT
ELECT COATED BLADE 2.86 ST (ELECTRODE) ×2 IMPLANT
ELECT REM PT RETURN 9FT ADLT (ELECTROSURGICAL) ×2
ELECTRODE REM PT RTRN 9FT ADLT (ELECTROSURGICAL) ×1 IMPLANT
EVACUATOR SILICONE 100CC (DRAIN) ×4 IMPLANT
GAUZE SPONGE 4X4 12PLY STRL LF (GAUZE/BANDAGES/DRESSINGS) IMPLANT
GLOVE ECLIPSE 8.0 STRL XLNG CF (GLOVE) ×2 IMPLANT
GLOVE SRG 8 PF TXTR STRL LF DI (GLOVE) ×1 IMPLANT
GLOVE SURG UNDER POLY LF SZ8 (GLOVE) ×2
GOWN STRL REUS W/ TWL LRG LVL3 (GOWN DISPOSABLE) ×2 IMPLANT
GOWN STRL REUS W/ TWL XL LVL3 (GOWN DISPOSABLE) ×1 IMPLANT
GOWN STRL REUS W/TWL LRG LVL3 (GOWN DISPOSABLE) ×4
GOWN STRL REUS W/TWL XL LVL3 (GOWN DISPOSABLE) ×2
HEMOSTAT ARISTA ABSORB 3G PWDR (HEMOSTASIS) ×12 IMPLANT
NDL SAFETY ECLIPSE 18X1.5 (NEEDLE) IMPLANT
NEEDLE HYPO 18GX1.5 SHARP (NEEDLE)
NEEDLE HYPO 25X1 1.5 SAFETY (NEEDLE) IMPLANT
NS IRRIG 1000ML POUR BTL (IV SOLUTION) IMPLANT
PACK BASIN DAY SURGERY FS (CUSTOM PROCEDURE TRAY) ×2 IMPLANT
PENCIL SMOKE EVACUATOR (MISCELLANEOUS) ×2 IMPLANT
PIN SAFETY STERILE (MISCELLANEOUS) ×2 IMPLANT
SLEEVE SCD COMPRESS KNEE MED (MISCELLANEOUS) ×2 IMPLANT
SPONGE LAP 18X18 RF (DISPOSABLE) ×4 IMPLANT
SPONGE LAP 4X18 RFD (DISPOSABLE) IMPLANT
STAPLER VISISTAT 35W (STAPLE) ×2 IMPLANT
STRIP CLOSURE SKIN 1/2X4 (GAUZE/BANDAGES/DRESSINGS) IMPLANT
SUT CHROMIC 3 0 SH 27 (SUTURE) IMPLANT
SUT ETHILON 2 0 FS 18 (SUTURE) ×4 IMPLANT
SUT MNCRL AB 3-0 PS2 18 (SUTURE) IMPLANT
SUT MON AB 4-0 PC3 18 (SUTURE) ×6 IMPLANT
SUT SILK 2 0 PERMA HAND 18 BK (SUTURE) ×2 IMPLANT
SUT SILK 2 0 SH (SUTURE) IMPLANT
SUT VIC AB 3-0 54X BRD REEL (SUTURE) IMPLANT
SUT VIC AB 3-0 BRD 54 (SUTURE)
SUT VICRYL 3-0 CR8 SH (SUTURE) ×8 IMPLANT
SYR CONTROL 10ML LL (SYRINGE) ×4 IMPLANT
TOWEL GREEN STERILE FF (TOWEL DISPOSABLE) ×4 IMPLANT
TRAY FAXITRON CT DISP (TRAY / TRAY PROCEDURE) IMPLANT
TUBE CONNECTING 20X1/4 (TUBING) ×2 IMPLANT
YANKAUER SUCT BULB TIP NO VENT (SUCTIONS) ×2 IMPLANT

## 2020-11-19 NOTE — Progress Notes (Signed)
Assisted Dr. Christella Hartigan with right, left, ultrasound guided, pectoralis block. Side rails up, monitors on throughout procedure. See vital signs in flow sheet. Tolerated Procedure well.

## 2020-11-19 NOTE — Interval H&P Note (Signed)
History and Physical Interval Note:  11/19/2020 8:29 AM  Rhonda Keller  has presented today for surgery, with the diagnosis of RIGHT BREAST CANCER.  The various methods of treatment have been discussed with the patient and family. After consideration of risks, benefits and other options for treatment, the patient has consented to  Procedure(s) with comments: RIGHT SIMPLE MASTECTOMY AND LEFT RISK REDUCING MASTECTOMY (Bilateral) - RNFA, PEC BLOCK; START TIME OF 8:45 AM FOR 120 MINUTES IN [place] [facility] as a surgical intervention.  The patient's history has been reviewed, patient examined, no change in status, stable for surgery.  I have reviewed the patient's chart and labs.  Questions were answered to the patient's satisfaction.     [place]

## 2020-11-19 NOTE — Transfer of Care (Signed)
Immediate Anesthesia Transfer of Care Note  Patient: Rhonda Keller  Procedure(s) Performed: RIGHT SIMPLE MASTECTOMY AND LEFT RISK REDUCING MASTECTOMY (Bilateral Breast)  Patient Location: PACU  Anesthesia Type:GA combined with regional for post-op pain  Level of Consciousness: awake, alert  and oriented  Airway & Oxygen Therapy: Patient Spontanous Breathing and Patient connected to face mask oxygen  Post-op Assessment: Report given to RN and Post -op Vital signs reviewed and stable  Post vital signs: Reviewed and stable  Last Vitals:  Vitals Value Taken Time  BP 105/52 11/19/20 1121  Temp    Pulse 91 11/19/20 1124  Resp 16 11/19/20 1124  SpO2 98 % 11/19/20 1124  Vitals shown include unvalidated device data.  Last Pain:  Vitals:   11/19/20 0703  TempSrc: Oral  PainSc: 5       Patients Stated Pain Goal: 2 (79/48/01 6553)  Complications: No complications documented.

## 2020-11-19 NOTE — Anesthesia Procedure Notes (Signed)
Anesthesia Regional Block: Pectoralis block  Laterality: Right      Narrative:  Start time: 11/19/2020 8:29 AM End time: 11/19/2020 8:31 AM

## 2020-11-19 NOTE — Anesthesia Preprocedure Evaluation (Addendum)
Anesthesia Evaluation  Patient identified by MRN, date of birth, ID band Patient awake    Reviewed: Allergy & Precautions, NPO status , Patient's Chart, lab work & pertinent test results  History of Anesthesia Complications Negative for: history of anesthetic complications  Airway Mallampati: II  TM Distance: >3 FB Neck ROM: Full    Dental  (+) Teeth Intact   Pulmonary neg pulmonary ROS,    Pulmonary exam normal        Cardiovascular negative cardio ROS Normal cardiovascular exam     Neuro/Psych negative psych ROS   GI/Hepatic negative GI ROS, Neg liver ROS,   Endo/Other  Morbid obesity  Renal/GU negative Renal ROS  negative genitourinary   Musculoskeletal negative musculoskeletal ROS (+)   Abdominal   Peds  Hematology  (+) anemia , Hgb 10.9   Anesthesia Other Findings  Breast cancer H/o Ewing's sarcoma  Reproductive/Obstetrics                            Anesthesia Physical Anesthesia Plan  ASA: III  Anesthesia Plan: General   Post-op Pain Management: GA combined w/ Regional for post-op pain   Induction: Intravenous  PONV Risk Score and Plan: 3 and Ondansetron, Dexamethasone, Treatment may vary due to age or medical condition and Midazolam  Airway Management Planned: Oral ETT  Additional Equipment: None  Intra-op Plan:   Post-operative Plan: Extubation in OR  Informed Consent: I have reviewed the patients History and Physical, chart, labs and discussed the procedure including the risks, benefits and alternatives for the proposed anesthesia with the patient or authorized representative who has indicated his/her understanding and acceptance.     Dental advisory given  Plan Discussed with:   Anesthesia Plan Comments:         Anesthesia Quick Evaluation

## 2020-11-19 NOTE — Anesthesia Postprocedure Evaluation (Signed)
Anesthesia Post Note  Patient: Rhonda Keller  Procedure(s) Performed: RIGHT SIMPLE MASTECTOMY AND LEFT RISK REDUCING MASTECTOMY (Bilateral Breast)     Patient location during evaluation: PACU Anesthesia Type: Regional Level of consciousness: awake and alert Pain management: pain level controlled Vital Signs Assessment: post-procedure vital signs reviewed and stable Respiratory status: spontaneous breathing, nonlabored ventilation and respiratory function stable Cardiovascular status: blood pressure returned to baseline and stable Postop Assessment: no apparent nausea or vomiting Anesthetic complications: no   No complications documented.  Last Vitals:  Vitals:   11/19/20 1210 11/19/20 1214  BP: 117/78   Pulse: 87   Resp: 18   Temp: 36.8 C   SpO2: 91% 95%    Last Pain:  Vitals:   11/19/20 1221  TempSrc:   PainSc: 7                  Lidia Collum

## 2020-11-19 NOTE — Anesthesia Procedure Notes (Signed)
Procedure Name: Intubation Date/Time: 11/19/2020 8:48 AM Performed by: Maryella Shivers, CRNA Pre-anesthesia Checklist: Patient identified, Emergency Drugs available, Suction available and Patient being monitored Patient Re-evaluated:Patient Re-evaluated prior to induction Oxygen Delivery Method: Circle system utilized Preoxygenation: Pre-oxygenation with 100% oxygen Induction Type: IV induction Ventilation: Mask ventilation without difficulty Laryngoscope Size: Mac and 3 Grade View: Grade I Tube type: Oral Tube size: 7.0 mm Number of attempts: 1 Airway Equipment and Method: Stylet and Oral airway Placement Confirmation: ETT inserted through vocal cords under direct vision,  positive ETCO2 and breath sounds checked- equal and bilateral Secured at: 21 cm Tube secured with: Tape Dental Injury: Teeth and Oropharynx as per pre-operative assessment

## 2020-11-19 NOTE — Op Note (Signed)
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  Of note there were 3 right axillary nodes removed with right mastectomy specimen.  There is no other palpable abnormalities in the right axilla and significant scarring from previous axillary node biopsy  Drains: TWO 19 round drains 1 to each side indications for procedure: The patient is a 60 year old female with a history of right breast cancer treated back in 2013 with breast conserving surgery and postoperative radiation therapy.  Of note she did receive radiation to her left side with a remote history of DCIS as well as a Ewing sarcoma.  She was seen in consultation but was undecided for many months and placed on antiestrogen therapy as she made her mind up.  She is debilitated and needs both upper extremities to move and this was causing quite a conundrum for her to figure out how she would be able to function postoperatively.  Breast conserving surgery was discussed with failure rates in her case were to be high given her previous radiation to both breasts in the past and also her height and risk of cancer for both breast going forward.  After long deliberation, she opted for bilateral simple mastectomy without reconstruction.  She concerns about lymphedema we discussed potentially limiting lymph node dissections in this case.  She was in agreement to that and therefore we chose not to do a sentinel lymph node mapping procedure and just remove anything that felt abnormal after discussion of the pros and cons of this approach and that it was not quite the standard of care for this but given her unique situation this would be her choice  and she felt this was best understanding the risk of not doing it.The surgical and non surgical options have been discussed with the patient.  Risks of surgery include bleeding,  Infection,  Flap necrosis,  Tissue loss,  Chronic pain, death, Numbness, potential lymphedema risk given previous surgery and radiation therapy and the need for additional procedures.  Reconstruction options also have been discussed with the patient as well.  The patient agrees to proceed.    Description of procedure: The patient was met in holding area and questions were answered.  The procedure reviewed and she wished to proceed.  She is taken back up in room.  She is placed supine upon the OR table.  After induction of general anesthesia, both breasts were prepped and draped sterile fashion timeout performed.  Proper patient, site and procedure were verified and she received appropriate preoperative antibiotics.  Of note she received bilateral pectoral blocks per anesthesia.  The right side was done first.  Curvilinear incision was made above and below the nipple were complex.  Dissection was carried down toward the right clavicle and inframammary fold.  This was taken medially to the midline.  The breast was then dissected the chest wall in a medial lateral fashion until the lateral attachments were encountered.  These were divided and the breast was removed.  On palpation of her right axilla, she had multiple clips from previous lymph node biopsy procedure 2013.  There were 2 abnormal nodes which I removed.  Remainder of her examination was normal.  The axillary vein, long thoracic nerve and thoracodorsal trunk  were preserved.  Her superior skin flap showed some signs of ischemia therefore took additional superior skin flap especially given her history of previous radiation therapy.  Wound was irrigated with antibiotic solution.  Arista was placed.  The inferior flap single drain was placed 19 round secured to the skin with 2-0  nylon.  Wound closed with 3-0 Vicryl for Monocryl.  Left side was then done.  In similar fashion a superior and inferior skin flap was raised.  Dissection was carried to the clavicle and inframammary fold.  The left breast was then dissect off the chest wall in a medial lateral fashion off the pectoralis muscle and to the lateral attachments were found and divided.  Hemostasis achieved.  Antibiotic irrigation used.  Both skin flaps are viable.  There is a slight dogear at the medial portion of this and this was excised.  After irrigation with antibiotic solution hemostasis achieved with cautery and Arista was placed.  Through an inferior stab incision 19 round drain placed.  This was secured with 2-0 nylon.  Wound closed with 3-0 Vicryl and 4-0 Monocryl.  Dermabond applied.  All counts were found to be correct.  Breast binder placed.  Patient was then awoke extubated taken to recovery in satisfactory condition.

## 2020-11-19 NOTE — Anesthesia Procedure Notes (Signed)
Anesthesia Regional Block: Pectoralis block   Pre-Anesthetic Checklist: ,, timeout performed, Correct Patient, Correct Site, Correct Laterality, Correct Procedure, Correct Position, site marked, Risks and benefits discussed,  Surgical consent,  Pre-op evaluation,  At surgeon's request and post-op pain management  Laterality: Left  Prep: chloraprep       Needles:  Injection technique: Single-shot  Needle Type: Echogenic Stimulator Needle     Needle Length: 10cm  Needle Gauge: 20     Additional Needles:   Procedures:,,,, ultrasound used (permanent image in chart),,,,  Narrative:  Start time: 11/19/2020 8:26 AM End time: 11/19/2020 8:29 AM Injection made incrementally with aspirations every 5 mL.  Performed by: Personally  Anesthesiologist: Lidia Collum, MD  Additional Notes: Standard monitors applied. Skin prepped. Good needle visualization with ultrasound. Injection made in 5cc increments with no resistance to injection. Patient tolerated the procedure well.

## 2020-11-19 NOTE — H&P (Signed)
Rhonda Keller  Location: [facility] Patient #: [id number] DOB: 08-31-1961 Single / Language: Cleophus Molt / Race: White Female  History of Present Illness  Patient words: Patient returns for follow-up of her right breast cancer. She seen earlier this year placed on anti-estrogens. 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 She's having more pain and numbness and weakness of bilateral upper extremities. She's been evaluated by neurosurgery who recommends fusion with decompression. She's quite symptomatic today from that she states. She did not tolerate anti-estrogens due to hot flashes despite being switched over multiple occasions. She stopped taking her anti-estrogens about 2 months ago due to hot flashes. I've asked her to restart those it able bias more time. She understands that she is at a higher risk of local regional recurrence and systemic disease without them.  The patient is a 60 year old female.   Allergies Penicillins Itching. Allergies Reconciled  Medication History Omeprazole (40MG  Capsule DR, Oral daily) Active. Medications Reconciled    Vitals Weight: 235.4 lb Height: 62in Body Surface Area: 2.05 m Body Mass Index: 43.05 kg/m  Temp.: 67F(Tympanic)  Pulse: 84 (Regular)  BP: 130/82(Sitting, Left Arm, Standard)        Physical Exam   General Mental Status-Alert. General Appearance-Consistent with stated age. Hydration-Well hydrated. Voice-Normal.  Breast Previous lumpectomy changes / scars noted bilaterally with post treatment changes .  Neurologic Note: Mild bilateral upper extremity weakness to grip strength noted. Patient complains of bilateral procedures which are mild episodic.  Lymphatic Head & Neck  General Head & Neck Lymphatics: Bilateral - Description - Normal. Axillary  General Axillary  Region: Bilateral - Description - Normal. Tenderness - Non Tender.  Axilla without adenopathy   Assessment & Plan   BREAST CANCER, STAGE 1, RIGHT (C50.911) Impression: pt has opted for bilateral mastectomy  Given previous surgery/ treatment, success of SLN mapping decreased in this setting  Pt has concerns about lymphedema and needs both upper extremities  For mobility purposes given lower extremity weakness and need for walker etc  She has opted out of SLN mapping and understands the significance of this decision but feels that is what is best for her.    She has opted for risk reducing left mastectomy  Given past history and high risk of new cancer   The surgical and non surgical options have been discussed with the patient.  Risks of surgery include bleeding,  Infection,  Flap necrosis,  Tissue loss,  Chronic pain, death, Numbness,  And the need for additional procedures.  Reconstruction options also have been discussed with the patient as well.  The patient agrees to proceed.  Current Plans Pt Education - [facility] Breast Cancer Information Given - [facility] "Breast Journey" Package Pt Education - [facility] Mastectomy HCI Pt Education - [facility] Mastectomy HCI

## 2020-11-20 ENCOUNTER — Encounter (HOSPITAL_BASED_OUTPATIENT_CLINIC_OR_DEPARTMENT_OTHER): Payer: Self-pay | Admitting: Surgery

## 2020-11-20 NOTE — Discharge Instructions (Signed)
[facility] [place] [facility] [place] °[phone] ° °MASTECTOMY: POST OP INSTRUCTIONS ° °Always review your discharge instruction sheet given to you by the facility where your surgery was performed. °IF YOU HAVE DISABILITY OR FAMILY LEAVE FORMS, YOU MUST BRING THEM TO THE OFFICE FOR PROCESSING.   °DO NOT GIVE THEM TO YOUR DOCTOR. °A prescription for pain medication may be given to you upon discharge.  Take your pain medication as prescribed, if needed.  If narcotic pain medicine is not needed, then you may take acetaminophen (Tylenol) or ibuprofen (Advil) as needed. °1. Take your usually prescribed medications unless otherwise directed. °2. If you need a refill on your pain medication, please contact your pharmacy.  They will contact our office to request authorization.  Prescriptions will not be filled after 5pm or on week-ends. °3. You should follow a light diet the first few days after arrival home, such as soup and crackers, etc.  Resume your normal diet the day after surgery. °4. Most patients will experience some swelling and bruising on the chest and underarm.  Ice packs will help.  Swelling and bruising can take several days to resolve.  °5. It is common to experience some constipation if taking pain medication after surgery.  Increasing fluid intake and taking a stool softener (such as Colace) will usually help or prevent this problem from occurring.  A mild laxative (Milk of Magnesia or Miralax) should be taken according to package instructions if there are no bowel movements after 48 hours. °6. Unless discharge instructions indicate otherwise, leave your bandage dry and in place until your next appointment in 3-5 days.  You may take a limited sponge bath.  No tube baths or showers until the drains are removed.  You may have steri-strips (small skin tapes) in place directly over the incision.  These strips should be left on the skin for 7-10 days.  If your surgeon used skin glue on the incision, you may  shower in 24 hours.  The glue will flake off over the next 2-3 weeks.  Any sutures or staples will be removed at the office during your follow-up visit. °7. DRAINS:  If you have drains in place, it is important to keep a list of the amount of drainage produced each day in your drains.  Before leaving the hospital, you should be instructed on drain care.  Call our office if you have any questions about your drains. °8. ACTIVITIES:  You may resume regular (light) daily activities beginning the next day--such as daily self-care, walking, climbing stairs--gradually increasing activities as tolerated.  You may have sexual intercourse when it is comfortable.  Refrain from any heavy lifting or straining until approved by your doctor. °a. You may drive when you are no longer taking prescription pain medication, you can comfortably wear a seatbelt, and you can safely maneuver your car and apply brakes. °b. RETURN TO WORK:  __________________________________________________________ °9. You should see your doctor in the office for a follow-up appointment approximately 3-5 days after your surgery.  Your doctor’s nurse will typically make your follow-up appointment when she calls you with your pathology report.  Expect your pathology report 2-3 business days after your surgery.  You may call to check if you do not hear from us after three days.   °10. OTHER INSTRUCTIONS: ______________________________________________________________________________________________ ____________________________________________________________________________________________ ° °WHEN TO CALL YOUR DOCTOR: °1. Fever over 101.0 °2. Nausea and/or vomiting °3. Extreme swelling or bruising °4. Continued bleeding from incision. °5. Increased pain, redness, or drainage from the   The clinic staff is available to answer your questions during regular business hours.  Please don't hesitate to call and ask to speak to one of the nurses for clinical  concerns.  If you have a medical emergency, go to the nearest emergency room or call 911.  A surgeon from [facility] is always on call at the hospital. [address] [place], [place], [place]  [address] ? [address] [place] [place], [place], [place]   [address] [phone] ? [phone] ? FAX [phone] Web site: [place] Instructions  Activity: Get plenty of rest for the remainder of the day. A responsible individual must stay with you for 24 hours following the procedure.  For the next 24 hours, DO NOT: -Drive a car -Paediatric nurse -Drink alcoholic beverages -Take any medication unless instructed by your physician -Make any legal decisions or sign important papers.  Meals: Start with liquid foods such as gelatin or soup. Progress to regular foods as tolerated. Avoid greasy, spicy, heavy foods. If nausea and/or vomiting occur, drink only clear liquids until the nausea and/or vomiting subsides. Call your physician if vomiting continues.  Special Instructions/Symptoms: Your throat may feel dry or sore from the anesthesia or the breathing tube placed in your throat during surgery. If this causes discomfort, gargle with warm salt water. The discomfort should disappear within 24 hours.  If you had a scopolamine patch placed behind your ear for the management of post- operative nausea and/or vomiting:  1. The medication in the patch is effective for 72 hours, after which it should be removed.  Wrap patch in a tissue and discard in the trash. Wash hands thoroughly with soap and water. 2. You may remove the patch earlier than 72 hours if you experience unpleasant side effects which may include dry mouth, dizziness or visual disturbances. 3. Avoid touching the patch. Wash your hands with soap and water after contact with the patch.      About my Jackson-Pratt Bulb Drain  What is a Jackson-Pratt bulb? A Jackson-Pratt is a soft, round device used  to collect drainage. It is connected to a long, thin drainage catheter, which is held in place by one or two small stiches near your surgical incision site. When the bulb is squeezed, it forms a vacuum, forcing the drainage to empty into the bulb.  Emptying the Jackson-Pratt bulb- To empty the bulb: 1. Release the plug on the top of the bulb. 2. Pour the bulb's contents into a measuring container which your nurse will provide. 3. Record the time emptied and amount of drainage. Empty the drain(s) as often as your     doctor or nurse recommends.  Date                  Time                    Amount (Drain 1)                 Amount (Drain 2)  _____________________________________________________________________  _____________________________________________________________________  _____________________________________________________________________  _____________________________________________________________________  _____________________________________________________________________  _____________________________________________________________________  _____________________________________________________________________  _____________________________________________________________________  Squeezing the Jackson-Pratt Bulb- To squeeze the bulb: 1. Make sure the plug at the top of the bulb is open. 2. Squeeze the bulb tightly in your fist. You will hear air squeezing from the bulb. 3. Replace the plug while the bulb is squeezed. 4. Use a safety pin to attach the bulb to your clothing.  This will keep the catheter from     pulling at the bulb insertion site.  When to call your doctor- Call your doctor if:  Drain site becomes red, swollen or hot.  You have a fever greater than 101 degrees F.  There is oozing at the drain site.  Drain falls out (apply a guaze bandage over the drain hole and secure it with tape).  Drainage increases daily not related to activity patterns.  (You will usually have more drainage when you are active than when you are resting.)  Drainage has a bad odor.

## 2020-11-20 NOTE — Discharge Summary (Signed)
Physician Discharge Summary  Patient ID: ITZAYANNA KASTER MRN: [id number] DOB/AGE: July 07, 1961 60 y.o.  Admit date: 11/19/2020 Discharge date: 11/20/2020  Admission Diagnoses:right breast cancer history of breast cancer   Discharge Diagnoses:  Active Problems:   Breast cancer, right [facility])   Discharged Condition: good  Hospital Course: Pt did well postop.  Tolerated her diet, had good pain control and able to ambulate.     }  Treatments: surgery: bilateral mastectomy   Discharge Exam: Blood pressure 124/83, pulse 99, temperature 98.5 F (36.9 C), resp. rate 18, height 5\' 2"  (1.575 m), weight 105 kg, SpO2 97 %. General appearance: alert and cooperative Resp: clear to auscultation bilaterally Cardio: regular rate and rhythm Incision/Wound:incisions CDI No flap necrosis or hematoma bilaterally JP serous bilaterally   Disposition: Discharge disposition: 01-Home or Self Care       Discharge Instructions    Diet - low sodium heart healthy   Complete by: As directed    Increase activity slowly   Complete by: As directed         Signed: Joyice Faster Meylin Stenzel 11/20/2020, 9:09 AM

## 2020-11-21 LAB — SURGICAL PATHOLOGY

## 2020-11-26 ENCOUNTER — Encounter: Payer: Self-pay | Admitting: *Deleted

## 2020-12-02 NOTE — Progress Notes (Incomplete)
[facility]  [phone] [phone]     ID: Rhonda Keller DOB: 02-Jan-1961  MR#: [id number]  JYN#:[id number]  Patient Care Team: Patient, No Pcp Per as PCP - General (General Practice) Mauro Kaufmann, RN as Oncology Nurse Navigator Rockwell Germany, RN as Oncology Nurse Navigator Erroll Luna, MD as Consulting Physician (General Surgery) Magrinat, Virgie Dad, MD as Consulting Physician (Oncology) Kyung Rudd, MD as Consulting Physician (Radiation Oncology) Kristeen Miss, MD as Consulting Physician (Neurosurgery) Bobbye Charleston, MD as Consulting Physician (Obstetrics and Gynecology) Robley Fries, MD as Consulting Physician (Urology) Aurea Graff OTHER MD: Garlon Hatchet, MD [phone] opthamology)   CHIEF COMPLAINT: 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   Since her last visit, she underwent bilateral mastectomies on 11/19/2020 under Dr. Brantley Stage. Pathology from the procedure [phone]) showed: 1. Right Breast  - invasive ductal carcinoma, 1.3 cm, grade 2  - negative resection margins 2. Right Lymph Nodes (3)  - negative for carcinoma (0/3) 3. Left Breast  - benign breast parenchyma showing fibrocystic change with calcifications  She started anastrozole on 02/16/2020.  So far she is tolerating it remarkably well.  She is having no problems with hot flashes or vaginal dryness.  She is having severe mouth dryness from a different medication, a urologic drug that Dr. Claudia Desanctis prescribed for her.   REVIEW OF SYSTEMS: Rhonda Keller    COVID 19 VACCINATION STATUS:    HISTORY OF CURRENT ILLNESS: From the original intake note:  Rhonda Keller has a history of bilateral breast cancers. Right breast cancer was diagnosed in 01/1998, invasive ductal carcinoma, node  positive, reportedly estrogen and progesterone receptor negative and treated with lumpectomy, adjuvant radiation therapy, and 4 cycles of Taxol (Taxotere?) followed by 6 cycles of CMF. 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  The patient recalls taking tamoxifen for some time.  She also has a history of Ewing's carcinoma, which was diagnosed in 65 (age 58) and treated with surgery, radiation therapy, and chemotherapy in [place].  More recently she had routine screening mammography on 11/24/2019 showing a possible abnormality in the right breast. She underwent right diagnostic mammography with tomography and right breast ultrasonography at [facility] on 11/29/2019 showing: breast density category B; there was a 1.2 cm asymmetry in the lower-inner right breast without sonographic correlate; normal-appearing right axilla lymph nodes.  Accordingly on 11/30/2019 she proceeded to biopsy of the right breast area in question. The pathology from this procedure ([id number]) showed: invasive and in situ mammary carcinoma, grade 2, e-cadherin positive. Prognostic indicators significant for: estrogen receptor, >95% positive and progesterone receptor, >95% positive, both with strong staining intensity. Proliferation marker Ki67 at 10%. HER2 equivocal by immunohistochemistry (2+), but negative by fluorescent in situ hybridization with a signals ratio 1.46 and number per cell 1.90.  Her case was also presented at the multidisciplinary breast cancer conference on 12/06/2019. At that time a preliminary plan was proposed: Mastectomy or consideration of bilateral mastectomy, with no sentinel lymph node sampling think she must have had axillary lymph node dissection previously, likely no further radiation since she has already had radiation, but yes antiestrogens, and genetics.  The patient's subsequent history is as detailed below   PAST MEDICAL  HISTORY: Past Medical History:  Diagnosis Date  . Breast cancer (  [place])   . 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 Dysphagia, idiopathic 03/29/2012  . Ewing's sarcoma of bone [facility]) 03/29/2012   Dec 1985 presenting with cord compression  Rx surgery, RT, chemo  . Personal history of chemotherapy   . Personal history of radiation therapy     PAST SURGICAL HISTORY: Past Surgical History:  Procedure Laterality Date  . BACK SURGERY    . BREAST BIOPSY    . BREAST LUMPECTOMY     bilateral   . BREAST SURGERY    . TOTAL MASTECTOMY Bilateral 11/19/2020   Procedure: RIGHT SIMPLE MASTECTOMY AND LEFT RISK REDUCING MASTECTOMY;  Surgeon: Erroll Luna, MD;  Location: [facility];  Service: General;  Laterality: Bilateral;  RNFA, PEC BLOCK; START TIME OF 8:45 AM FOR 120 MINUTES IN [place] IQ    FAMILY HISTORY: Family History  Problem Relation Age of Onset  . Breast cancer Mother   Patient's father is currently 56 years old and her mother is 33 years old (as of 12/05/19). On her father's side, there is no cancer to her knowoledge.  The patient's mother had breast cancer but Rhonda Keller is not sure at what age.  The patient has 1 brother and 0 sisters. There is no ovarian, pancreatic, or prostate cancer in the family to the patient's knowledge.   GYNECOLOGIC HISTORY:  No LMP recorded. Patient is postmenopausal. Menarche: 54-13 years old [place] P 0 LMP "years ago," she thinks early 60's Contraceptive used in her 12's without issue. HRT never used  Hysterectomy? no BSO? 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 She is single. She lives at home alone with her two dogs, a ShiTzu and a Morphy.     ADVANCED DIRECTIVES: In place-- She has named Rhonda Keller [phone]) as her HCPOA   HEALTH MAINTENANCE: Social History   Tobacco Use  . Smoking status: Never Smoker  .  Smokeless tobacco: Never Used  Substance Use Topics  . Alcohol use: Yes    Comment: every other week  . Drug use: No     Colonoscopy: 2018  PAP: 2019  Bone density: date unknown   Allergies  Allergen Reactions  . Penicillins Itching    Current Outpatient Medications  Medication Sig Dispense Refill  . HYDROcodone-acetaminophen (NORCO/VICODIN) 5-325 MG tablet Take 1 tablet by mouth every 6 (six) hours as needed for moderate pain. 15 tablet 0  . ibuprofen (ADVIL) 800 MG tablet Take 1 tablet (800 mg total) by mouth every 8 (eight) hours as needed. 30 tablet 0  . letrozole (FEMARA) 2.5 MG tablet Take 1 tablet (2.5 mg total) by mouth daily. 30 tablet 6  . omeprazole (PRILOSEC) 20 MG capsule Take by mouth.    . phenazopyridine (PYRIDIUM) 95 MG tablet Take 95 mg by mouth 2 (two) times daily.     No current facility-administered medications for this visit.    OBJECTIVE: white woman   There were no vitals filed for this visit.   There is no height or weight on file to calculate BMI.   Wt Readings from Last 3 Encounters:  11/19/20 231 lb 7.7 oz (105 kg)  12/06/19 224 lb 12.8 oz (102 kg)  11/13/13 203 lb (92.1 kg)      ECOG FS:2 - Symptomatic, <50% confined to bed  Sclerae unicteric, EOMs intact Wearing a mask No  cervical or supraclavicular adenopathy Lungs no rales or rhonchi Heart regular rate and rhythm Abd soft, nontender, positive bowel sounds MSK no focal spinal tenderness, no upper extremity lymphedema Neuro: nonfocal, well oriented, appropriate affect Breasts:    {Televisit}  LAB RESULTS:  CMP     Component Value Date/Time   NA 138 11/19/2020 0708   NA 143 03/28/2013 1238   K 3.9 11/19/2020 0708   K 4.3 03/28/2013 1238   CL 103 11/19/2020 0708   CO2 25 11/19/2020 0708   CO2 30 (H) 03/28/2013 1238   GLUCOSE 163 (H) 11/19/2020 0708   GLUCOSE 104 03/28/2013 1238   BUN 15 11/19/2020 0708   BUN 22.3 03/28/2013 1238   CREATININE 0.85 11/19/2020 0708    CREATININE 0.79 12/06/2019 1208   CREATININE 0.9 03/28/2013 1238   CALCIUM 9.8 11/19/2020 0708   CALCIUM 10.2 03/28/2013 1238   PROT 6.8 11/19/2020 0708   PROT 7.5 03/28/2013 1238   ALBUMIN 3.5 11/19/2020 0708   ALBUMIN 3.6 03/28/2013 1238   AST 19 11/19/2020 0708   AST 14 (L) 12/06/2019 1208   AST 22 03/28/2013 1238   ALT 14 11/19/2020 0708   ALT 15 12/06/2019 1208   ALT 19 03/28/2013 1238   ALKPHOS 71 11/19/2020 0708   ALKPHOS 67 03/28/2013 1238   BILITOT 0.4 11/19/2020 0708   BILITOT 0.3 12/06/2019 1208   BILITOT 0.24 03/28/2013 1238   GFRNONAA >60 11/19/2020 0708   GFRNONAA >60 12/06/2019 1208   GFRAA >60 12/06/2019 1208    No results found for: TOTALPROTELP, ALBUMINELP, A1GS, A2GS, BETS, BETA2SER, GAMS, MSPIKE, SPEI  Lab Results  Component Value Date   WBC 7.9 11/19/2020   NEUTROABS 5.3 11/19/2020   HGB 10.9 (L) 11/19/2020   HCT 37.0 11/19/2020   MCV 76.4 (L) 11/19/2020   PLT 203 11/19/2020    No results found for: LABCA2  No components found for: JYNWGN562  No results for input(s): INR in the last 168 hours.  No results found for: LABCA2  No results found for: ZHY865  No results found for: HQI696  No results found for: EXB284  No results found for: CA2729  No components found for: HGQUANT  No results found for: CEA1 / No results found for: CEA1   No results found for: AFPTUMOR  No results found for: CHROMOGRNA  No results found for: KPAFRELGTCHN, LAMBDASER, KAPLAMBRATIO (kappa/lambda light chains)  No results found for: HGBA, HGBA2QUANT, HGBFQUANT, HGBSQUAN (Hemoglobinopathy evaluation)   Lab Results  Component Value Date   LDH 160 03/23/2012    No results found for: IRON, TIBC, IRONPCTSAT (Iron and TIBC)  No results found for: FERRITIN  Urinalysis No results found for: COLORURINE, APPEARANCEUR, LABSPEC, PHURINE, GLUCOSEU, HGBUR, BILIRUBINUR, KETONESUR, PROTEINUR, UROBILINOGEN, NITRITE, LEUKOCYTESUR   STUDIES: No results  found.   ELIGIBLE FOR AVAILABLE RESEARCH PROTOCOL: AET  ASSESSMENT: 60 y.o. [place] woman status post right breast lower inner quadrant biopsy 11/30/2019 for a clinical T1c N0, stage IA invasive ductal carcinoma, grade 2, estrogen and progesterone receptor positive, with an MIB-1 of 10% and no HER-2 amplification.  (1) note in the history of present illness that the patient has had bilateral lumpectomies and bilateral breast irradiation in the past as well as a history of chemotherapy for Ewing's sarcoma and for breast cancer.  (2) genetics testing  (3) definitive surgery pending  (4) started anastrozole 02/16/2020   (a) she took tamoxifen for uncertain period of time in the past   PLAN:  [place]om is  tolerating anastrozole well.  This gives [place] a little time for her to think about surgery.  I think she should proceed with bilateral mastectomies.  She probably will need to go to a rehab facility for 2 to 4 weeks after that but I think that can be arranged beforehand.  She does need to discuss all this with her surgeon Dr. Brantley Stage at some point.  Right now we are going to schedule her for a repeat right mammogram and ultrasound in August.  I will see her shortly after that.  At that point again we can discuss the surgery issue.  She knows to call for any other issue that may develop before the next visit.   Virgie Dad. Magrinat, MD 12/02/2020 8:17 PM Medical Oncology and Hematology [facility] [place], [place] [address] Tel. [phone]    Fax. [phone]   This document serves as a record of services personally performed by Lurline Del, MD. It was created on his behalf by Wilburn Mylar, a trained medical scribe. The creation of this record is based on the scribe's personal observations and the provider's statements to them.   I, Lurline Del MD, have reviewed the above documentation for accuracy and completeness, and I agree with the  above.   *Total Encounter Time as defined by the Centers for Medicare and Medicaid Services includes, in addition to the face-to-face time of a patient visit (documented in the note above) non-face-to-face time: obtaining and reviewing outside history, ordering and reviewing medications, tests or procedures, care coordination (communications with other health care professionals or caregivers) and documentation in the medical record.

## 2020-12-03 ENCOUNTER — Telehealth: Payer: Self-pay | Admitting: Oncology

## 2020-12-03 ENCOUNTER — Inpatient Hospital Stay: Payer: 59 | Admitting: Oncology

## 2020-12-03 NOTE — Telephone Encounter (Signed)
Rescheduled appt per pt's request. Pt not feeling well. Pt confirmed new appt date/time.

## 2020-12-04 ENCOUNTER — Encounter: Payer: Self-pay | Admitting: *Deleted

## 2020-12-04 ENCOUNTER — Other Ambulatory Visit: Payer: Self-pay | Admitting: Oncology

## 2020-12-06 ENCOUNTER — Telehealth: Payer: Self-pay | Admitting: Oncology

## 2020-12-06 NOTE — Telephone Encounter (Signed)
Left message with option to change upcoming appointment to virtual or in-person with added labs before.

## 2020-12-06 NOTE — Telephone Encounter (Signed)
Changed upcoming appointment to virtual per patient's request. Patient is aware of changes.

## 2020-12-08 NOTE — Progress Notes (Signed)
[name]  [phone] [phone]     ID: ARDYTH Keller DOB: Apr 02, 1961  MR#: [id number]  XHB#:[id number]  Patient Care Team: Patient, No Pcp Per as PCP - General (General Practice) Mauro Kaufmann, RN as Oncology Nurse Navigator Rockwell Germany, RN as Oncology Nurse Navigator Erroll Luna, MD as Consulting Physician (General Surgery) Walterine Amodei, Virgie Dad, MD as Consulting Physician (Oncology) Kyung Rudd, MD as Consulting Physician (Radiation Oncology) Kristeen Miss, MD as Consulting Physician (Neurosurgery) Bobbye Charleston, MD as Consulting Physician (Obstetrics and Gynecology) Robley Fries, MD as Consulting Physician (Urology) Chauncey Cruel, MD OTHER MD: Garlon Hatchet, MD [phone] opthamology)  I connected with Cathie Beams on 12/09/20 at 11:00 AM EDT by telephone visit and verified that I am speaking with the correct person using two identifiers.   I discussed the limitations, risks, security and privacy concerns of performing an evaluation and management service by telemedicine and the availability of in-person appointments. I also discussed with the patient that there may be a patient responsible charge related to this service. The patient expressed understanding and agreed to proceed.   Other persons participating in the visit and their role in the encounter: The patient's dog (very vocal!).  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   She started anastrozole on 02/16/2020.  So far she is tolerating it remarkably well.  She is having no problems with hot flashes or vaginal dryness.   Since her last visit, she underwent right simple  mastectomy and left risk-reducing mastectomy on 11/19/2020 under Dr. Brantley Stage. Pathology from the procedure [id number]) showed: 1. Right Breast  - invasive ductal carcinoma, 1.3 cm, grade 1  - resection margins negative 2. Right Lymph Nodes, 3  - negative for carcinoma (0/3) 3. Left Breast  - benign breast parenchyma showing fibrocystic change with calcifications   REVIEW OF SYSTEMS: [name] did very well as far as pain is concerned until the nerve block drifted off.  She has been very uncomfortable since that time.  She is not really taking any pain medicine.  She was started on antibiotics because of possible fever and that has "torn up her stomach).  She still has 2 drains in place.  Currently they have drained at 20 and 30 cc so far today and she hopes that they may be removed tomorrow when she sees Dr. Brantley Stage again.  She tells me she cannot get herself to look at the wound so she does not know if there is any dehiscence or not.  A detailed review of systems today was otherwise stable   COVID 19 VACCINATION STATUS:    HISTORY OF CURRENT ILLNESS: From the original intake note:  MILDERD Keller has a history of bilateral breast cancers. Right breast cancer was diagnosed in 01/1998, invasive ductal carcinoma, node positive, reportedly estrogen and progesterone receptor negative and treated with lumpectomy, adjuvant radiation therapy, and 4 cycles of Taxol (Taxotere?) followed by 6 cycles of CMF. 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  The patient recalls taking tamoxifen for some time.  She also has a history of Ewing's carcinoma, which was diagnosed in 62 (age 53) and treated with surgery, radiation therapy, and  chemotherapy in [place].  More recently she had routine screening mammography on 11/24/2019 showing a possible abnormality in the right breast. She underwent right diagnostic mammography with tomography and right  breast ultrasonography at [facility] on 11/29/2019 showing: breast density category B; there was a 1.2 cm asymmetry in the lower-inner right breast without sonographic correlate; normal-appearing right axilla lymph nodes.  Accordingly on 11/30/2019 she proceeded to biopsy of the right breast area in question. The pathology from this procedure ([id number]) showed: invasive and in situ mammary carcinoma, grade 2, e-cadherin positive. Prognostic indicators significant for: estrogen receptor, >95% positive and progesterone receptor, >95% positive, both with strong staining intensity. Proliferation marker Ki67 at 10%. HER2 equivocal by immunohistochemistry (2+), but negative by fluorescent in situ hybridization with a signals ratio 1.46 and number per cell 1.90.  Her case was also presented at the multidisciplinary breast cancer conference on 12/06/2019. At that time a preliminary plan was proposed: Mastectomy or consideration of bilateral mastectomy, with no sentinel lymph node sampling think she must have had axillary lymph node dissection previously, likely no further radiation since she has already had radiation, but yes antiestrogens, and genetics.  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   Dec 1985 presenting with cord compression  Rx surgery, RT, chemo   Personal history of chemotherapy    Personal history of radiation therapy     PAST SURGICAL HISTORY: Past Surgical History:  Procedure Laterality Date   BACK SURGERY     BREAST BIOPSY     BREAST LUMPECTOMY     bilateral    BREAST SURGERY     TOTAL MASTECTOMY Bilateral 11/19/2020   Procedure: RIGHT SIMPLE MASTECTOMY AND LEFT RISK REDUCING  MASTECTOMY;  Surgeon: Erroll Luna, MD;  Location: [place];  Service: General;  Laterality: Bilateral;  RNFA, PEC BLOCK; START TIME OF 8:45 AM FOR 120 MINUTES IN [facility] IQ    FAMILY HISTORY: Family History  Problem Relation Age of Onset   Breast cancer Mother   Patient's father is currently 14 years old and her mother is 43 years old (as of 12/05/19). On her father's side, there is no cancer to her knowoledge.  The patient's mother had breast cancer but Ms. Castleman is not sure at what age.  The patient has 1 brother and 0 sisters. There is no ovarian, pancreatic, or prostate cancer in the family to the patient's knowledge.   GYNECOLOGIC HISTORY:  No LMP recorded. Patient is postmenopausal. Menarche: [age] years old Connell P 0 LMP "years ago," she thinks early 43's Contraceptive used in her 77's without issue. HRT never used  Hysterectomy? no BSO? 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 She is single. She lives at home alone with her two dogs, a ShiTzu and a Morphy.     ADVANCED DIRECTIVES: In place-- She has named Fredrik Rigger [phone]) as her HCPOA   HEALTH MAINTENANCE: Social History   Tobacco Use   Smoking status: Never Smoker   Smokeless tobacco: Never Used  Substance Use Topics   Alcohol use: Yes    Comment: every other week   Drug use:  No     Colonoscopy: 2018  PAP: 2019  Bone density: date unknown   Allergies  Allergen Reactions   Penicillins Itching    Current Outpatient Medications  Medication Sig Dispense Refill   HYDROcodone-acetaminophen (NORCO/VICODIN) 5-325 MG tablet Take 1 tablet by mouth every 6 (six) hours as needed for moderate pain. 15 tablet 0   ibuprofen (ADVIL) 800 MG tablet Take 1 tablet (800 mg total) by mouth every 8 (eight) hours as needed. 30 tablet 0   letrozole (FEMARA) 2.5 MG tablet Take 1 tablet (2.5 mg total) by mouth daily. 30 tablet 6   omeprazole (PRILOSEC)  20 MG capsule Take by mouth.     phenazopyridine (PYRIDIUM) 95 MG tablet Take 95 mg by mouth 2 (two) times daily.     No current facility-administered medications for this visit.    OBJECTIVE: white woman   There were no vitals filed for this visit.   There is no height or weight on file to calculate BMI.   Wt Readings from Last 3 Encounters:  11/19/20 231 lb 7.7 oz (105 kg)  12/06/19 224 lb 12.8 oz (102 kg)  11/13/13 203 lb (92.1 kg)      ECOG FS:2 - Symptomatic, <50% confined to bed  Televisit   LAB RESULTS:  CMP     Component Value Date/Time   NA 138 11/19/2020 0708   NA 143 03/28/2013 1238   K 3.9 11/19/2020 0708   K 4.3 03/28/2013 1238   CL 103 11/19/2020 0708   CO2 25 11/19/2020 0708   CO2 30 (H) 03/28/2013 1238   GLUCOSE 163 (H) 11/19/2020 0708   GLUCOSE 104 03/28/2013 1238   BUN 15 11/19/2020 0708   BUN 22.3 03/28/2013 1238   CREATININE 0.85 11/19/2020 0708   CREATININE 0.79 12/06/2019 1208   CREATININE 0.9 03/28/2013 1238   CALCIUM 9.8 11/19/2020 0708   CALCIUM 10.2 03/28/2013 1238   PROT 6.8 11/19/2020 0708   PROT 7.5 03/28/2013 1238   ALBUMIN 3.5 11/19/2020 0708   ALBUMIN 3.6 03/28/2013 1238   AST 19 11/19/2020 0708   AST 14 (L) 12/06/2019 1208   AST 22 03/28/2013 1238   ALT 14 11/19/2020 0708   ALT 15 12/06/2019 1208   ALT 19 03/28/2013 1238   ALKPHOS 71 11/19/2020 0708   ALKPHOS 67 03/28/2013 1238   BILITOT 0.4 11/19/2020 0708   BILITOT 0.3 12/06/2019 1208   BILITOT 0.24 03/28/2013 1238   GFRNONAA >60 11/19/2020 0708   GFRNONAA >60 12/06/2019 1208   GFRAA >60 12/06/2019 1208    No results found for: TOTALPROTELP, ALBUMINELP, A1GS, A2GS, BETS, BETA2SER, GAMS, MSPIKE, SPEI  Lab Results  Component Value Date   WBC 7.9 11/19/2020   NEUTROABS 5.3 11/19/2020   HGB 10.9 (L) 11/19/2020   HCT 37.0 11/19/2020   MCV 76.4 (L) 11/19/2020   PLT 203 11/19/2020    No results found for: LABCA2  No components found for: RUEAVW098  No results  for input(s): INR in the last 168 hours.  No results found for: LABCA2  No results found for: JXB147  No results found for: WGN562  No results found for: ZHY865  No results found for: CA2729  No components found for: HGQUANT  No results found for: CEA1 / No results found for: CEA1   No results found for: AFPTUMOR  No results found for: CHROMOGRNA  No results found for: KPAFRELGTCHN, LAMBDASER, KAPLAMBRATIO (kappa/lambda light chains)  No results found for: HGBA, HGBA2QUANT, HGBFQUANT, HGBSQUAN (Hemoglobinopathy  evaluation)   Lab Results  Component Value Date   LDH 160 03/23/2012    No results found for: IRON, TIBC, IRONPCTSAT (Iron and TIBC)  No results found for: FERRITIN  Urinalysis No results found for: COLORURINE, APPEARANCEUR, LABSPEC, PHURINE, GLUCOSEU, HGBUR, BILIRUBINUR, KETONESUR, PROTEINUR, UROBILINOGEN, NITRITE, LEUKOCYTESUR   STUDIES: No results found.   ELIGIBLE FOR AVAILABLE RESEARCH PROTOCOL: AET  ASSESSMENT: 60 y.o. [place] woman status post right breast lower inner quadrant biopsy 11/30/2019 for a clinical T1c N0, stage IA invasive ductal carcinoma, grade 2, estrogen and progesterone receptor positive, with an MIB-1 of 10% and no HER-2 amplification.  (1) note in the history of present illness that the patient has had bilateral lumpectomies and bilateral breast irradiation in the past as well as a history of chemotherapy for Ewing's sarcoma and for breast cancer.  (2) genetics testing  (3) status post bilateral mastectomies 11/19/2020, showing  (a) on the right, a pT1c pN0, stage IA invasive ductal carcinoma, grade 1, with negative margins.   (i) a total of 3 right axillary lymph nodes removed, all clear  (b) on the left, benign breast parenchyma  (4) started anastrozole 02/16/2020, switched to letrozole August 2021  (a) she took tamoxifen for uncertain period of time in the past   PLAN:  Laiyah is still actively recovering from the  surgery which is the reason we did this virtually.  Otherwise the plan will be to see her again in June and reassess at that time.  I do not know if she qualifies for genetics testing.  If she does we can obtain that at the next visit  She is tolerating letrozole well and the plan will be to continue that a total of 5 years.  For pain I suggested that she use Aleve 220 mg together with Tylenol 500 mg up to 3 times a day as needed.  I have found that very efficacious with minimal side effects and generally no endorgan concerns.  She knows to call for any other issue that may develop before the next visit.     Virgie Dad. Dorotea Hand, MD 12/09/2020 10:56 AM Medical Oncology and Hematology [facility] [place], [place] [address] Tel. [phone]    Fax. [phone]   This document serves as a record of services personally performed by Lurline Del, MD. It was created on his behalf by Wilburn Mylar, a trained medical scribe. The creation of this record is based on the scribe's personal observations and the provider's statements to them.   I, Lurline Del MD, have reviewed the above documentation for accuracy and completeness, and I agree with the above.   *Total Encounter Time as defined by the Centers for Medicare and Medicaid Services includes, in addition to the face-to-face time of a patient visit (documented in the note above) non-face-to-face time: obtaining and reviewing outside history, ordering and reviewing medications, tests or procedures, care coordination (communications with other health care professionals or caregivers) and documentation in the medical record.

## 2020-12-09 ENCOUNTER — Inpatient Hospital Stay: Payer: 59 | Attending: Oncology | Admitting: Oncology

## 2020-12-09 ENCOUNTER — Inpatient Hospital Stay: Payer: 59

## 2020-12-09 DIAGNOSIS — Z923 Personal history of irradiation: Secondary | ICD-10-CM | POA: Insufficient documentation

## 2020-12-09 DIAGNOSIS — C50311 Malignant neoplasm of lower-inner quadrant of right female breast: Secondary | ICD-10-CM | POA: Insufficient documentation

## 2020-12-09 DIAGNOSIS — Z9013 Acquired absence of bilateral breasts and nipples: Secondary | ICD-10-CM | POA: Diagnosis not present

## 2020-12-09 DIAGNOSIS — C419 Malignant neoplasm of bone and articular cartilage, unspecified: Secondary | ICD-10-CM | POA: Diagnosis not present

## 2020-12-09 DIAGNOSIS — Z79811 Long term (current) use of aromatase inhibitors: Secondary | ICD-10-CM | POA: Insufficient documentation

## 2020-12-09 DIAGNOSIS — Z880 Allergy status to penicillin: Secondary | ICD-10-CM | POA: Diagnosis not present

## 2020-12-09 DIAGNOSIS — Z170 Estrogen receptor positive status [ER+]: Secondary | ICD-10-CM | POA: Diagnosis not present

## 2020-12-09 DIAGNOSIS — D0512 Intraductal carcinoma in situ of left breast: Secondary | ICD-10-CM

## 2020-12-09 DIAGNOSIS — N6489 Other specified disorders of breast: Secondary | ICD-10-CM | POA: Insufficient documentation

## 2020-12-09 DIAGNOSIS — Z79899 Other long term (current) drug therapy: Secondary | ICD-10-CM | POA: Diagnosis not present

## 2020-12-09 DIAGNOSIS — Z803 Family history of malignant neoplasm of breast: Secondary | ICD-10-CM | POA: Diagnosis not present

## 2020-12-09 MED ORDER — LETROZOLE 2.5 MG PO TABS: 2.5000 mg | ORAL_TABLET | Freq: Every day | ORAL | 4 refills | Status: DC

## 2020-12-10 ENCOUNTER — Telehealth: Payer: Self-pay | Admitting: Oncology

## 2020-12-10 ENCOUNTER — Encounter: Payer: Self-pay | Admitting: *Deleted

## 2020-12-10 NOTE — Telephone Encounter (Signed)
Scheduled appointments per 3/14 los. Spoke to patient who is aware of appointments date and times.

## 2021-03-12 ENCOUNTER — Telehealth: Payer: Self-pay | Admitting: Oncology

## 2021-03-12 NOTE — Telephone Encounter (Signed)
R/s appt per 6/15 sch msg. Pt aware.

## 2021-03-13 ENCOUNTER — Inpatient Hospital Stay: Payer: 59

## 2021-03-13 ENCOUNTER — Inpatient Hospital Stay: Payer: 59 | Admitting: Oncology

## 2021-05-01 ENCOUNTER — Inpatient Hospital Stay: Payer: 59

## 2021-05-01 ENCOUNTER — Other Ambulatory Visit: Payer: Self-pay

## 2021-05-01 ENCOUNTER — Inpatient Hospital Stay: Payer: 59 | Attending: Oncology | Admitting: Oncology

## 2021-05-01 VITALS — BP 152/83 | HR 95 | Temp 98.1°F | Resp 20 | Ht 62.0 in | Wt 224.5 lb

## 2021-05-01 DIAGNOSIS — D0510 Intraductal carcinoma in situ of unspecified breast: Secondary | ICD-10-CM

## 2021-05-01 DIAGNOSIS — D0512 Intraductal carcinoma in situ of left breast: Secondary | ICD-10-CM

## 2021-05-01 DIAGNOSIS — Z880 Allergy status to penicillin: Secondary | ICD-10-CM | POA: Insufficient documentation

## 2021-05-01 DIAGNOSIS — C419 Malignant neoplasm of bone and articular cartilage, unspecified: Secondary | ICD-10-CM

## 2021-05-01 DIAGNOSIS — Z9221 Personal history of antineoplastic chemotherapy: Secondary | ICD-10-CM | POA: Diagnosis not present

## 2021-05-01 DIAGNOSIS — Z923 Personal history of irradiation: Secondary | ICD-10-CM | POA: Insufficient documentation

## 2021-05-01 DIAGNOSIS — Z79811 Long term (current) use of aromatase inhibitors: Secondary | ICD-10-CM | POA: Diagnosis not present

## 2021-05-01 DIAGNOSIS — Z79899 Other long term (current) drug therapy: Secondary | ICD-10-CM | POA: Diagnosis not present

## 2021-05-01 DIAGNOSIS — Z9013 Acquired absence of bilateral breasts and nipples: Secondary | ICD-10-CM | POA: Insufficient documentation

## 2021-05-01 DIAGNOSIS — Z170 Estrogen receptor positive status [ER+]: Secondary | ICD-10-CM | POA: Diagnosis not present

## 2021-05-01 DIAGNOSIS — Z803 Family history of malignant neoplasm of breast: Secondary | ICD-10-CM | POA: Diagnosis not present

## 2021-05-01 DIAGNOSIS — C779 Secondary and unspecified malignant neoplasm of lymph node, unspecified: Secondary | ICD-10-CM | POA: Diagnosis not present

## 2021-05-01 DIAGNOSIS — C50919 Malignant neoplasm of unspecified site of unspecified female breast: Secondary | ICD-10-CM

## 2021-05-01 DIAGNOSIS — G8929 Other chronic pain: Secondary | ICD-10-CM | POA: Diagnosis not present

## 2021-05-01 DIAGNOSIS — N6489 Other specified disorders of breast: Secondary | ICD-10-CM | POA: Insufficient documentation

## 2021-05-01 DIAGNOSIS — C50311 Malignant neoplasm of lower-inner quadrant of right female breast: Secondary | ICD-10-CM | POA: Diagnosis present

## 2021-05-01 LAB — CBC WITH DIFFERENTIAL/PLATELET
Abs Immature Granulocytes: 0.03 10*3/uL (ref 0.00–0.07)
Basophils Absolute: 0 10*3/uL (ref 0.0–0.1)
Basophils Relative: 1 %
Eosinophils Absolute: 0.1 10*3/uL (ref 0.0–0.5)
Eosinophils Relative: 2 %
HCT: 34.1 % — ABNORMAL LOW (ref 36.0–46.0)
Hemoglobin: 10.2 g/dL — ABNORMAL LOW (ref 12.0–15.0)
Immature Granulocytes: 0 %
Lymphocytes Relative: 21 %
Lymphs Abs: 1.5 10*3/uL (ref 0.7–4.0)
MCH: 22 pg — ABNORMAL LOW (ref 26.0–34.0)
MCHC: 29.9 g/dL — ABNORMAL LOW (ref 30.0–36.0)
MCV: 73.5 fL — ABNORMAL LOW (ref 80.0–100.0)
Monocytes Absolute: 0.5 10*3/uL (ref 0.1–1.0)
Monocytes Relative: 7 %
Neutro Abs: 5 10*3/uL (ref 1.7–7.7)
Neutrophils Relative %: 69 %
Platelets: 215 10*3/uL (ref 150–400)
RBC: 4.64 MIL/uL (ref 3.87–5.11)
RDW: 16.9 % — ABNORMAL HIGH (ref 11.5–15.5)
WBC: 7.3 10*3/uL (ref 4.0–10.5)
nRBC: 0 % (ref 0.0–0.2)

## 2021-05-01 LAB — COMPREHENSIVE METABOLIC PANEL
ALT: 20 U/L (ref 0–44)
AST: 20 U/L (ref 15–41)
Albumin: 3.8 g/dL (ref 3.5–5.0)
Alkaline Phosphatase: 99 U/L (ref 38–126)
Anion gap: 12 (ref 5–15)
BUN: 18 mg/dL (ref 8–23)
CO2: 27 mmol/L (ref 22–32)
Calcium: 10.7 mg/dL — ABNORMAL HIGH (ref 8.9–10.3)
Chloride: 102 mmol/L (ref 98–111)
Creatinine, Ser: 0.88 mg/dL (ref 0.44–1.00)
GFR, Estimated: >60 mL/min (ref >60)
Glucose, Bld: 119 mg/dL — ABNORMAL HIGH (ref 70–99)
Potassium: 4.1 mmol/L (ref 3.5–5.1)
Sodium: 141 mmol/L (ref 135–145)
Total Bilirubin: 0.2 mg/dL — ABNORMAL LOW (ref 0.3–1.2)
Total Protein: 7.6 g/dL (ref 6.5–8.1)

## 2021-05-01 MED ORDER — NAPROXEN SODIUM 220 MG PO TABS: 220.0000 mg | ORAL_TABLET | Freq: Three times a day (TID) | ORAL | 6 refills | Status: DC

## 2021-05-01 MED ORDER — LETROZOLE 2.5 MG PO TABS
2.5000 mg | ORAL_TABLET | Freq: Every day | ORAL | 4 refills | Status: DC
Start: 2021-05-01 — End: 2021-05-08

## 2021-05-01 MED ORDER — GABAPENTIN 300 MG PO CAPS
300.0000 mg | ORAL_CAPSULE | Freq: Every day | ORAL | 4 refills | Status: DC
Start: 2021-05-01 — End: 2021-05-08

## 2021-05-01 MED ORDER — ACETAMINOPHEN 500 MG PO TABS: 500.0000 mg | ORAL_TABLET | Freq: Three times a day (TID) | ORAL | 6 refills | Status: DC

## 2021-05-01 NOTE — Progress Notes (Signed)
[place]  Telephone:(336) [phone] Fax:(336) [phone]     ID: Rhonda Keller DOB: 1961/07/20  MR#: [id number]  UXN#:[id number]  Patient Care Team: Patient, No Pcp Per (Inactive) as PCP - General (General Practice) Mauro Kaufmann, RN as Oncology Nurse Navigator Rockwell Germany, RN as Oncology Nurse Navigator Erroll Luna, MD as Consulting Physician (General Surgery) Jovon Winterhalter, Virgie Dad, MD as Consulting Physician (Oncology) Kyung Rudd, MD as Consulting Physician (Radiation Oncology) Kristeen Miss, MD as Consulting Physician (Neurosurgery) Bobbye Charleston, MD as Consulting Physician (Obstetrics and Gynecology) Robley Fries, MD as Consulting Physician (Urology) Chauncey Cruel, MD OTHER MD: Garlon Hatchet, MD [phone] opthamology)   CHIEF COMPLAINT: 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   She started letrozole August 2021.  So far she is tolerating it well.  She is having no significant problems with hot flashes or vaginal dryness.   However she is having uncontrolled pain.  She says it feels like there is a piece of metal in the middle of her chest.  It is heavy and painful.  She also hates the way her chest looks-- she has had no reconstruction and does not want to consider a flap or similar.   REVIEW OF SYSTEMS: Rhonda Keller tells me her pain is 24/7.  She has side Tylenol and high-dose Advil.  She apparently took the Tylenol and Aleve that I suggested to her previously once or twice but not in a regular fashion.  She is able to do her work which she does in her office mostly from home.  She has very little mobility.  The right arm which was treated previously by Dr. Ellene Route with good success now is very painful again and has a very limited range of motion.  She cannot drive for instance she cannot  do any cleaning.  She has to have someone do those things for her.  She also continues to have cystitis symptoms.  She was evaluated for this by Dr. Claudia Desanctis in the past.  Rhonda Keller does not feel that intervention was particularly helpful.  She also does not think that she has a urine infection as her symptoms are chronic.   COVID 19 VACCINATION STATUS: She has been reluctant to get vaccinated   HISTORY OF CURRENT ILLNESS: From the original intake note:  Rhonda Keller has a history of bilateral breast cancers. Right breast cancer was diagnosed in 01/1998, invasive ductal carcinoma, node positive, reportedly estrogen and progesterone receptor negative and treated with lumpectomy, adjuvant radiation therapy, and 4 cycles of Taxol (Taxotere?) followed by 6 cycles of CMF. 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  The patient recalls taking tamoxifen for some time.  She also has a history of Ewing's carcinoma, which was diagnosed in 42 (age 70) and treated with surgery, radiation therapy, and chemotherapy in [place].  More recently she had routine screening mammography on 11/24/2019 showing a possible abnormality in the right breast. She underwent right diagnostic mammography with tomography and right breast ultrasonography at [facility] on 11/29/2019 showing: breast density category B; there was a 1.2 cm asymmetry in the lower-inner right breast without sonographic correlate; normal-appearing right axilla lymph nodes.  Accordingly on 11/30/2019 she proceeded to biopsy of the right breast area in question. The pathology from this procedure ([id number]) showed: invasive and  in situ mammary carcinoma, grade 2, e-cadherin positive. Prognostic indicators significant for: estrogen receptor, >95% positive and progesterone receptor, >95% positive, both with strong staining intensity. Proliferation marker Ki67 at 10%. HER2 equivocal by  immunohistochemistry (2+), but negative by fluorescent in situ hybridization with a signals ratio 1.46 and number per cell 1.90.  Her case was also presented at the multidisciplinary breast cancer conference on 12/06/2019. At that time a preliminary plan was proposed: Mastectomy or consideration of bilateral mastectomy, with no sentinel lymph node sampling think she must have had axillary lymph node dissection previously, likely no further radiation since she has already had radiation, but yes antiestrogens, and genetics.  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   Dec 1985 presenting with cord compression  Rx surgery, RT, chemo   Personal history of chemotherapy    Personal history of radiation therapy     PAST SURGICAL HISTORY: Past Surgical History:  Procedure Laterality Date   BACK SURGERY     BREAST BIOPSY     BREAST LUMPECTOMY     bilateral    BREAST SURGERY     TOTAL MASTECTOMY Bilateral 11/19/2020   Procedure: RIGHT SIMPLE MASTECTOMY AND LEFT RISK REDUCING MASTECTOMY;  Surgeon: Erroll Luna, MD;  Location: [facility];  Service: General;  Laterality: Bilateral;  RNFA, PEC BLOCK; START TIME OF 8:45 AM FOR 120 MINUTES IN [facility]    FAMILY HISTORY: Family History  Problem Relation Age of Onset   Breast cancer Mother   Patient's father is currently 63 years old and her mother is 78 years old (as of 12/05/19). On her father's side, there is no cancer to her knowoledge.  The patient's mother had breast cancer but Rhonda Keller is not sure at what age.  The patient has 1 brother and 0 sisters. There is no ovarian, pancreatic, or prostate cancer in the family to the patient's  knowledge.   GYNECOLOGIC HISTORY:  No LMP recorded. Patient is postmenopausal. Menarche: 6-33 years old [place] P 0 LMP "years ago," she thinks early 56's Contraceptive used in her 5's without issue. HRT never used  Hysterectomy? no BSO? 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 She is single. She lives at home alone with her two dogs, a ShiTzu and a Morphy.     ADVANCED DIRECTIVES: In place-- She has named Fredrik Rigger [phone]) as her HCPOA   HEALTH MAINTENANCE: Social History   Tobacco Use   Smoking status: Never   Smokeless tobacco: Never  Substance Use Topics   Alcohol use: Yes    Comment: every other week   Drug use: No     Colonoscopy: 2018  PAP: 2019  Bone density: date unknown   Allergies  Allergen Reactions   Penicillins Itching    Current Outpatient Medications  Medication Sig Dispense Refill   acetaminophen (TYLENOL) 500 MG tablet Take 1 tablet (500 mg total) by mouth in the morning, at noon, and at bedtime. Take with aleve 220 mg 90 tablet 6   gabapentin (NEURONTIN) 300 MG capsule Take 1 capsule (300 mg total) by mouth at bedtime. 90 capsule 4  naproxen sodium (ALEVE) 220 MG tablet Take 1 tablet (220 mg total) by mouth in the morning, at noon, and at bedtime. Take together with tylenol 500 mg 90 tablet 6   letrozole (FEMARA) 2.5 MG tablet Take 1 tablet (2.5 mg total) by mouth daily. 90 tablet 4   omeprazole (PRILOSEC) 20 MG capsule Take by mouth.     phenazopyridine (PYRIDIUM) 95 MG tablet Take 95 mg by mouth 2 (two) times daily.     No current facility-administered medications for this visit.    OBJECTIVE: white woman examined in a wheelchair  Vitals:   05/01/21 1441  BP: (!) 152/83  Pulse: 95  Resp: 20  Temp: 98.1 F (36.7 C)  SpO2: 96%     Body mass index is 41.06 kg/m.   Wt Readings from Last 3 Encounters:  05/01/21 224 lb 8 oz (101.8 kg)  11/19/20 231 lb 7.7 oz (105 kg)  12/06/19 224 lb  12.8 oz (102 kg)      ECOG FS:2 - Symptomatic, <50% confined to bed  Sclerae unicteric, EOMs intact Wearing a mask No cervical or supraclavicular adenopathy Lungs no rales or rhonchi Heart regular rate and rhythm Abd soft, nontender, positive bowel sounds MSK no focal spinal tenderness, no upper extremity lymphedema Neuro: nonfocal, well oriented, frustrated and tearful but not inappropriate affect Breasts: Status post bilateral mastectomies.  The incisions are healing nicely, without dehiscence, erythema, or swelling there is no evidence of chest wall recurrence.  Both axillae are benign.   LAB RESULTS:  CMP     Component Value Date/Time   NA 141 05/01/2021 1423   NA 143 03/28/2013 1238   K 4.1 05/01/2021 1423   K 4.3 03/28/2013 1238   CL 102 05/01/2021 1423   CO2 27 05/01/2021 1423   CO2 30 (H) 03/28/2013 1238   GLUCOSE 119 (H) 05/01/2021 1423   GLUCOSE 104 03/28/2013 1238   BUN 18 05/01/2021 1423   BUN 22.3 03/28/2013 1238   CREATININE 0.88 05/01/2021 1423   CREATININE 0.79 12/06/2019 1208   CREATININE 0.9 03/28/2013 1238   CALCIUM 10.7 (H) 05/01/2021 1423   CALCIUM 10.2 03/28/2013 1238   PROT 7.6 05/01/2021 1423   PROT 7.5 03/28/2013 1238   ALBUMIN 3.8 05/01/2021 1423   ALBUMIN 3.6 03/28/2013 1238   AST 20 05/01/2021 1423   AST 14 (L) 12/06/2019 1208   AST 22 03/28/2013 1238   ALT 20 05/01/2021 1423   ALT 15 12/06/2019 1208   ALT 19 03/28/2013 1238   ALKPHOS 99 05/01/2021 1423   ALKPHOS 67 03/28/2013 1238   BILITOT 0.2 (L) 05/01/2021 1423   BILITOT 0.3 12/06/2019 1208   BILITOT 0.24 03/28/2013 1238   GFRNONAA >60 05/01/2021 1423   GFRNONAA >60 12/06/2019 1208   GFRAA >60 12/06/2019 1208    No results found for: TOTALPROTELP, ALBUMINELP, A1GS, A2GS, BETS, BETA2SER, GAMS, MSPIKE, SPEI  Lab Results  Component Value Date   WBC 7.3 05/01/2021   NEUTROABS 5.0 05/01/2021   HGB 10.2 (L) 05/01/2021   HCT 34.1 (L) 05/01/2021   MCV 73.5 (L) 05/01/2021   PLT  215 05/01/2021    No results found for: LABCA2  No components found for: [id number]  No results for input(s): INR in the last 168 hours.  No results found for: LABCA2  No results found for: YSA630  No results found for: ZSW109  No results found for: NAT557  No results found for: CA2729  No components found for: HGQUANT  No  results found for: CEA1 / No results found for: CEA1   No results found for: AFPTUMOR  No results found for: CHROMOGRNA  No results found for: KPAFRELGTCHN, LAMBDASER, KAPLAMBRATIO (kappa/lambda light chains)  No results found for: HGBA, HGBA2QUANT, HGBFQUANT, HGBSQUAN (Hemoglobinopathy evaluation)   Lab Results  Component Value Date   LDH 160 03/23/2012    No results found for: IRON, TIBC, IRONPCTSAT (Iron and TIBC)  No results found for: FERRITIN  Urinalysis No results found for: COLORURINE, APPEARANCEUR, LABSPEC, PHURINE, GLUCOSEU, HGBUR, BILIRUBINUR, KETONESUR, PROTEINUR, UROBILINOGEN, NITRITE, LEUKOCYTESUR   STUDIES: No results found.   ELIGIBLE FOR AVAILABLE RESEARCH PROTOCOL: AET  ASSESSMENT: 60 y.o. [place] woman status post right breast lower inner quadrant biopsy 11/30/2019 for a clinical T1c N0, stage IA invasive ductal carcinoma, grade 2, estrogen and progesterone receptor positive, with an MIB-1 of 10% and no HER-2 amplification.  (1) note in the history of present illness that the patient has had bilateral lumpectomies and bilateral breast irradiation in the past as well as a history of chemotherapy for Ewing's sarcoma and for breast cancer.  (2) genetics testing  (3) status post bilateral mastectomies 11/19/2020, showing  (a) on the right, a pT1c pN0, stage IA invasive ductal carcinoma, grade 1, with negative margins.   (i) a total of 3 right axillary lymph nodes removed, all clear  (b) on the left, benign breast parenchyma  (4) started anastrozole 02/16/2020, switched to letrozole August 2021  (a) she took tamoxifen  for uncertain period of time in the past   PLAN:  Rhonda Keller is now 6 months out from her definitive surgery for breast cancer.  There is no evidence of disease activity or recurrence.  She is tolerating letrozole well and the plan will be to continue that for total of 5 years.  She has uncontrolled pain.  She has been waiting to see if the pain improves but despite her and Dr. Josetta Huddle best efforts she has made little progress.  This is affecting her daily life and even though she had significant limitations previously that is now considerably worse.  We discussed the fact that she has 2 options.  She can take chronic medication for chronic pain which is my recommendation or she can try to learn to live with the pain and accept limitations in what she can do.  I strongly suggested that she take chronic medication for pain and specifically I suggested she start Tylenol 500 mg with Aleve 220 mg 3 times a day every day with food.  I also started her on gabapentin at bedtime.  She will take 300 mg.  If she finds the gabapentin is helpful to the chest wall pain that she is experiencing then we can both increase the nighttime dose and also add daytime dosing at a 100 mg 3 times daily to see if that is helpful.  I urged her to take the medication exactly as prescribed so we could find out if it is going to work for her or not  I have set her up for a virtual visit in 1 week and we will discuss how she is doing at that time.  If she is making progress but needs further help we can always start Cymbalta 20 mg daily which I think would also be helpful  She is aware that Dr. Ellene Route, who she has worked with in the past, has an excellent pain clinic that she might be able to profit from.  Total encounter time 40 minutes.*  Virgie Dad. Chanan Detwiler, MD 05/01/2021 5:42 PM Medical Oncology and Hematology [facility] [place], [place] [address] Tel. [phone]    Fax.  [phone]   This document serves as a record of services personally performed by Lurline Del, MD. It was created on his behalf by Wilburn Mylar, a trained medical scribe. The creation of this record is based on the scribe's personal observations and the provider's statements to them.   I, Lurline Del MD, have reviewed the above documentation for accuracy and completeness, and I agree with the above.   *Total Encounter Time as defined by the Centers for Medicare and Medicaid Services includes, in addition to the face-to-face time of a patient visit (documented in the note above) non-face-to-face time: obtaining and reviewing outside history, ordering and reviewing medications, tests or procedures, care coordination (communications with other health care professionals or caregivers) and documentation in the medical record.

## 2021-05-08 ENCOUNTER — Telehealth: Payer: 59 | Admitting: Oncology

## 2021-05-08 ENCOUNTER — Other Ambulatory Visit: Payer: Self-pay | Admitting: *Deleted

## 2021-05-08 MED ORDER — ACETAMINOPHEN 500 MG PO TABS: 500.0000 mg | ORAL_TABLET | Freq: Three times a day (TID) | ORAL | 6 refills | Status: AC

## 2021-05-08 MED ORDER — GABAPENTIN 300 MG PO CAPS: 300.0000 mg | ORAL_CAPSULE | Freq: Every day | ORAL | 4 refills | Status: DC

## 2021-05-08 MED ORDER — LETROZOLE 2.5 MG PO TABS: 2.5000 mg | ORAL_TABLET | Freq: Every day | ORAL | 4 refills | Status: DC

## 2021-05-08 MED ORDER — NAPROXEN SODIUM 220 MG PO TABS: 220.0000 mg | ORAL_TABLET | Freq: Three times a day (TID) | ORAL | 6 refills | Status: AC

## 2021-05-22 ENCOUNTER — Encounter: Payer: Self-pay | Admitting: Oncology

## 2021-07-17 ENCOUNTER — Telehealth: Payer: 59 | Admitting: Oncology

## 2022-01-10 IMAGING — MR MR LUMBAR SPINE WO/W CM
4 of 7 series · 23 of 48 positions shown · IV contrast (multihance)
Comparison: [REDACTED] [HOSPITAL] [HOSPITAL]
Lumbar MRI 01/15/2016.

CLINICAL DATA: 58-year-old female with back pain. Back and leg
muscle weakness with numbness in the legs for 5 years. No known
injury. Prior surgery.

EXAM:
MRI LUMBAR SPINE WITHOUT AND WITH CONTRAST
TECHNIQUE: Multiplanar and multiecho pulse sequences of the lumbar spine were
obtained without and with intravenous contrast.
CONTRAST:  20mL MULTIHANCE GADOBENATE DIMEGLUMINE 529 MG/ML IV SOLN

[Series 2: T2 · sagittal · 4.0mm · 0.55mm/px · 5 of 16 slices shown (1 of 2)]
[im 1/16]
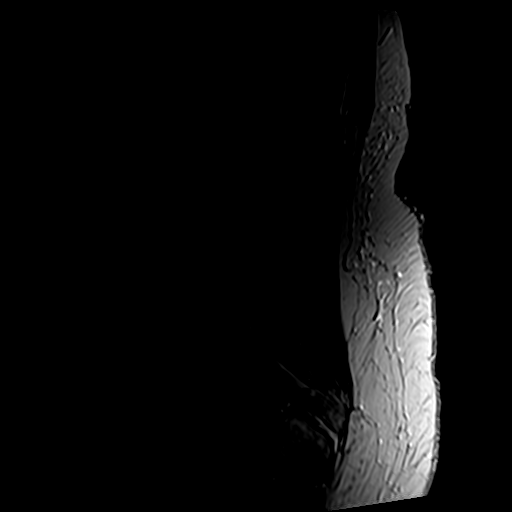
[im 4/16]
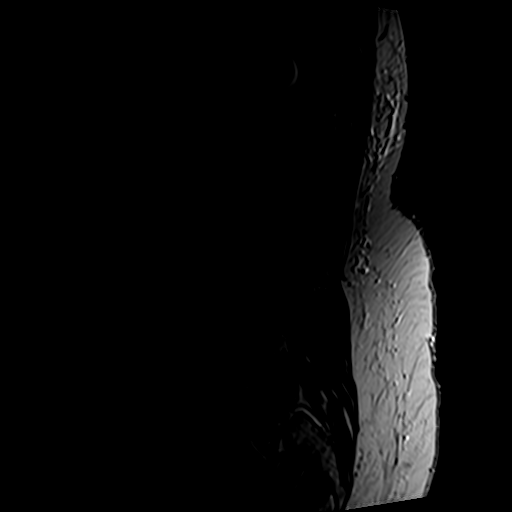
[im 8/16]
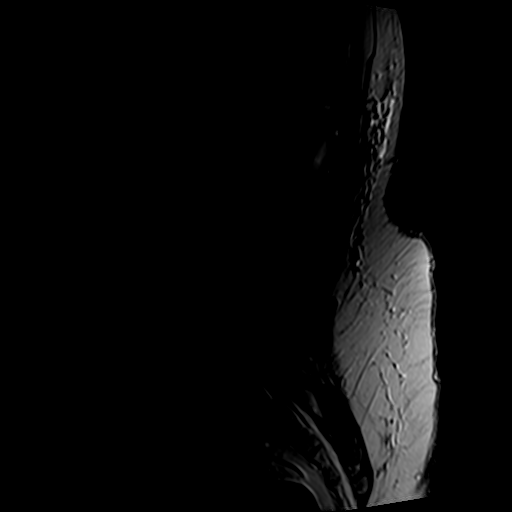
[im 12/16]
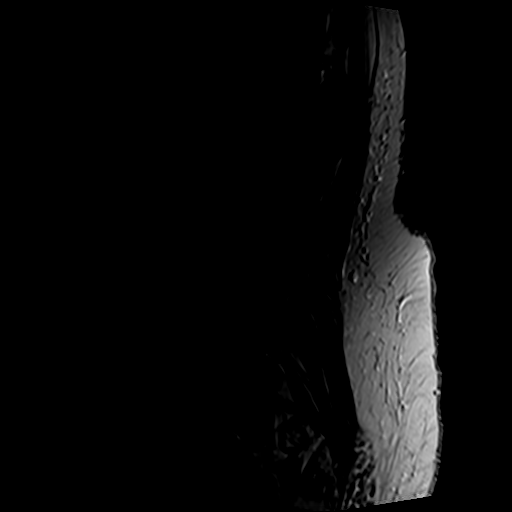
[im 16/16]
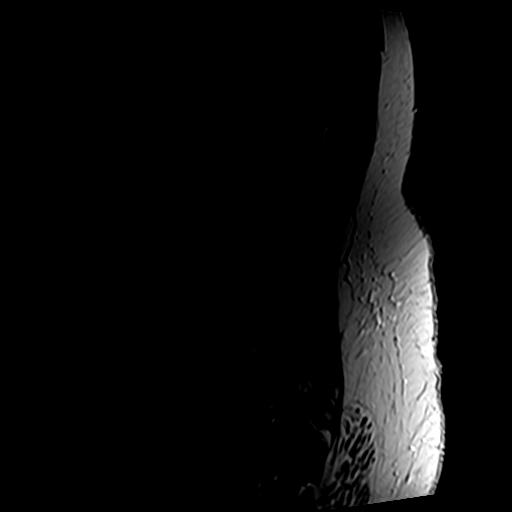

[Series 4: T1 · sagittal · 4.0mm · 0.55mm/px · 4 of 16 slices shown (1 of 2)]
[im 1/16]
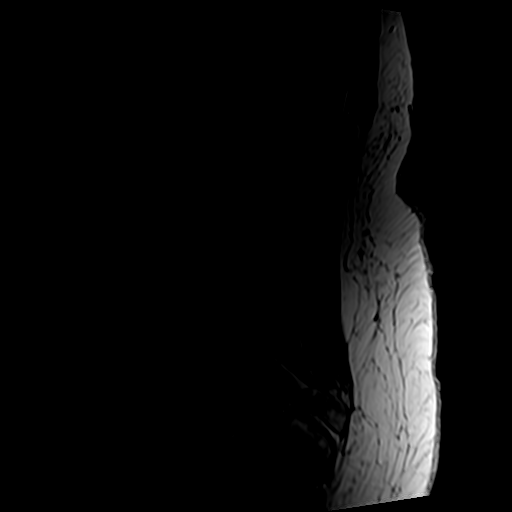
[im 6/16]
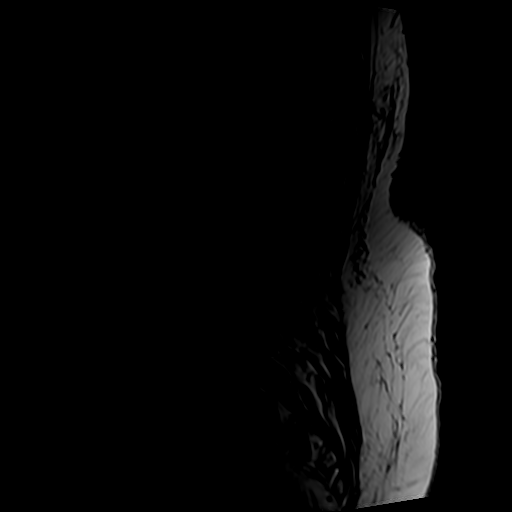
[im 11/16]
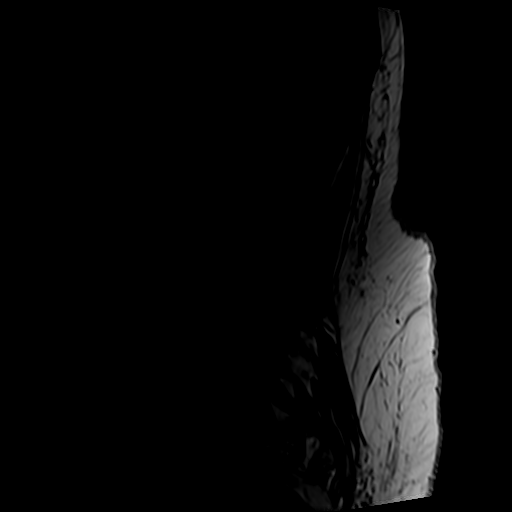
[im 16/16]
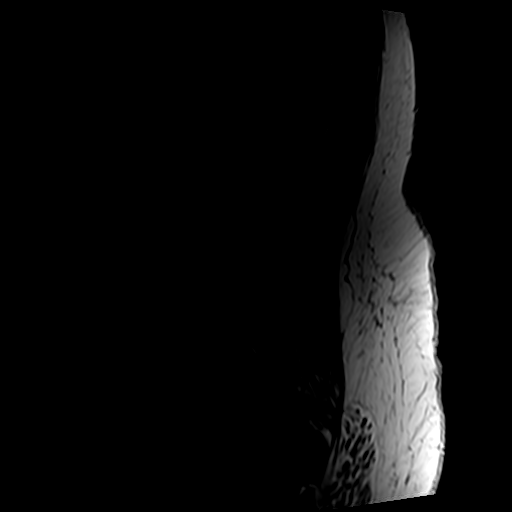

[Series 5: T2 · axial · 4.0mm · 0.70mm/px · z∈[-91,+109]mm · 8 of 36 slices shown (2 of 2)]
[im 1/36]
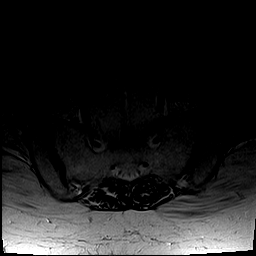
[im 4/36]
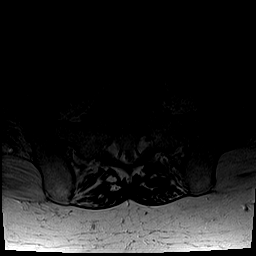
[im 12/36]
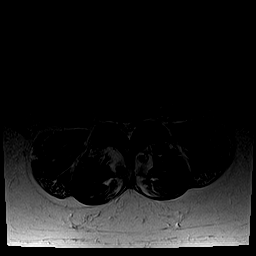
[im 16/36]
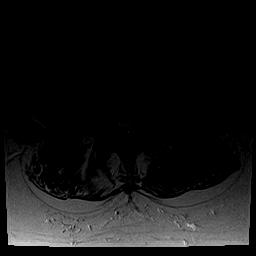
[im 20/36]
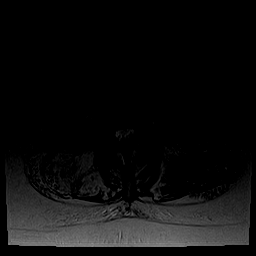
[im 24/36]
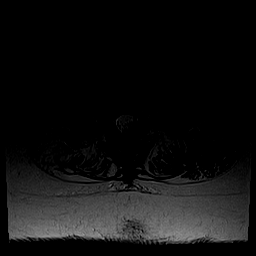
[im 32/36]
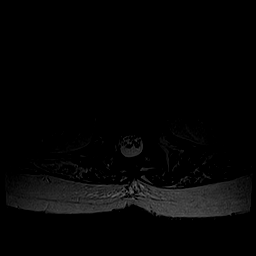
[im 36/36]
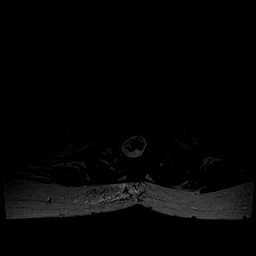

[Series 6: T1 · axial · 4.0mm · 0.35mm/px · z∈[-91,+88]mm · 6 of 36 slices shown (2 of 2)]
[im 1/36]
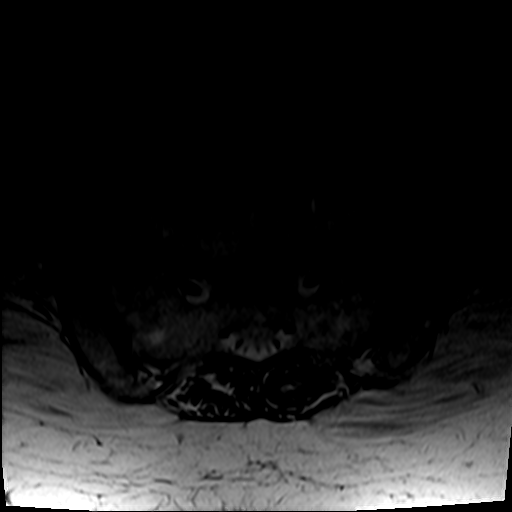
[im 4/36]
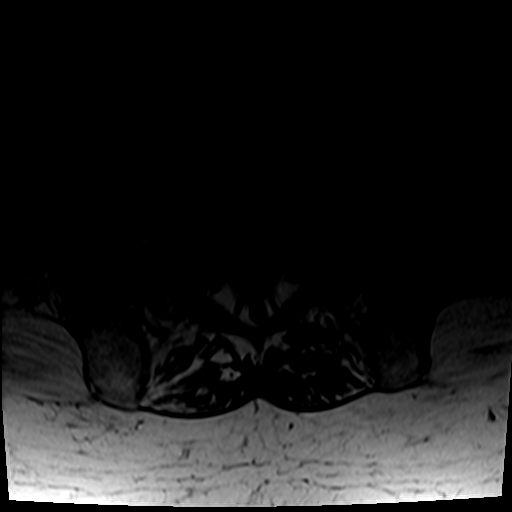
[im 12/36]
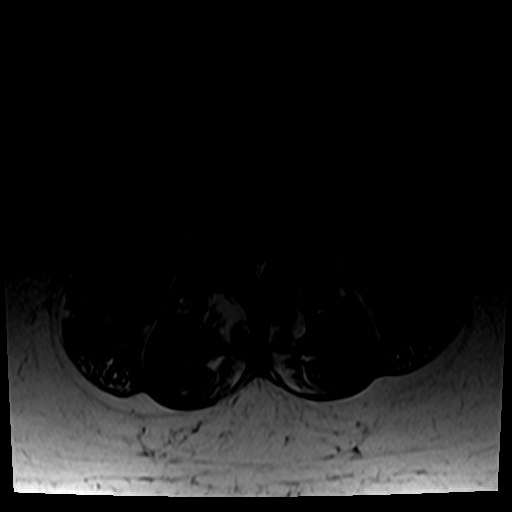
[im 16/36]
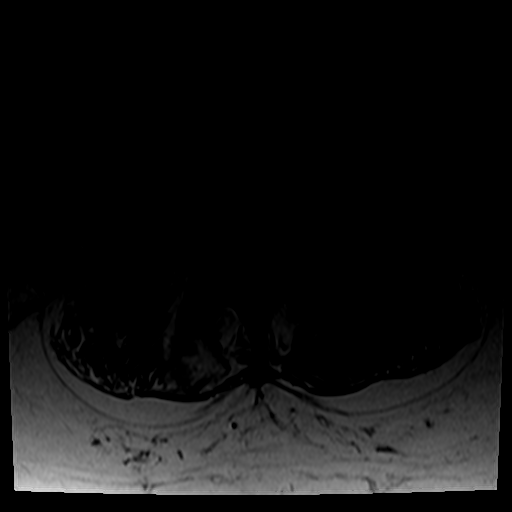
[im 20/36]
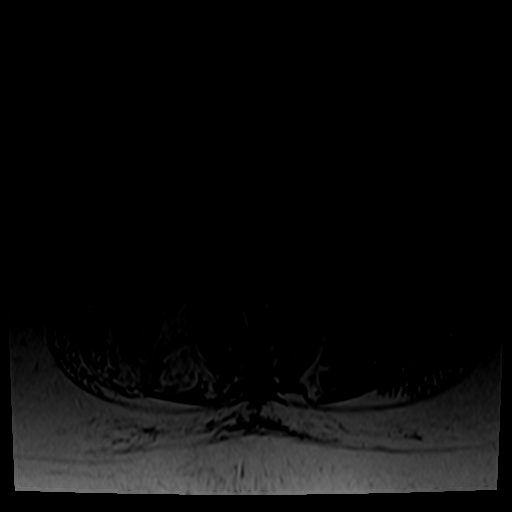
[im 32/36]
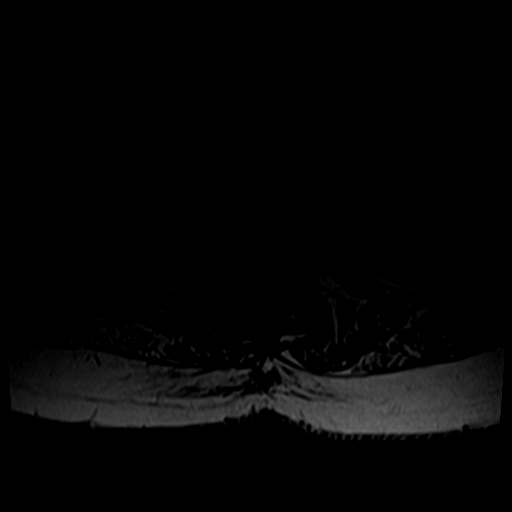

[23 of 48 positions shown; findings below may reference images not displayed]

FINDINGS: Segmentation: Lumbar segmentation appears to be normal and is the
same numbering system used on the 9869 MRI.

Alignment: Stable lordosis. Chronic mild retrolisthesis at L1-L2 and
L3-L4.

Vertebrae: Chronic deformity of the L1 vertebral body, with sequelae
of posterior decompression at that level. Generally mild
degenerative endplate marrow signal changes elsewhere. No marrow
edema or evidence of acute osseous abnormality. Intact visible
sacrum and SI joints.

Conus medullaris and cauda equina: Conus extends to the T12-L1
level. No lower spinal cord or conus signal abnormality.

Some of the ventral cauda equina nerve roots are chronically
enhancing near the conus at T12-L1 as seen on series 8, image 3.
This appears stable, and there is no nerve root thickening. No other
abnormal intradural enhancement. No definite dural thickening.

Paraspinal and other soft tissues: Postoperative changes to the
posterior paraspinal soft tissues at the thoracolumbar junction.
Stable visible kidneys with benign appearing bilateral renal cysts.

Disc levels:

No lower thoracic spinal stenosis.

T12-L1: Chronic posterior decompression with mild to moderate
residual posterior element hypertrophy. Granulation tissue suspected
in the right T12 neural foramen. No definite stenosis.

L1-L2: Chronic severe disc space loss with circumferential endplate
spurring. Prior posterior decompression. Architectural distortion at
the bilateral L1 neural foramina, with suspected mild to moderate
chronic residual stenosis on the right.

L2-L3: Negative disc. Moderate posterior element hypertrophy. Mild
L2 foraminal stenosis greater on the right.

L3-L4: Mild chronic retrolisthesis. Circumferential disc bulge with
broad-based posterior component and moderate facet, moderate to
severe ligament flavum hypertrophy. Chronic degenerative facet joint
fluid. Moderate to severe spinal stenosis and moderate bilateral
lateral recess stenosis. Moderate bilateral L3 foraminal stenosis.
This level has mildly progressed since 9869.

L4-L5: Circumferential disc bulge with moderate posterior element
hypertrophy and epidural lipomatosis. New posteriorly situated
synovial cysts on the right at this level which should not cause
neural compromise (series 5, image 24). Moderate spinal stenosis. No
convincing lateral recess stenosis. Mild L4 foraminal stenosis. This
level is stable.

L5-S1: Epidural lipomatosis chronically effaces CSF from the thecal
sac at this level. Negative disc, but moderate facet hypertrophy
greater on the right. No stenosis.
IMPRESSION: 1. Largely stable MRI appearance of the lumbar spine since 9869.
Chronic postoperative changes at T12-L1 and L1-L2 with mild chronic
enhancement of the ventral cauda equina at that level, presumably
degenerative/inflammatory. Architectural distortion at the T12 and
L1 neural foramina, with suspected moderate residual right L1
foraminal stenosis.

2. However, the L3-L4 level has mildly progressed since 9869 - where
there is stable mild retrolisthesis and multifactorial moderate to
severe spinal stenosis, moderate bilateral lateral recess and
foraminal stenosis.

3. Stable moderate multifactorial spinal stenosis at L4-L5, in part
related to epidural lipomatosis.

## 2022-06-02 ENCOUNTER — Telehealth: Payer: Self-pay | Admitting: Hematology and Oncology

## 2022-06-02 NOTE — Telephone Encounter (Signed)
Scheduled per 9/5 in basket, message has been left with pt

## 2022-06-12 ENCOUNTER — Other Ambulatory Visit: Payer: Self-pay | Admitting: *Deleted

## 2022-06-12 MED ORDER — LETROZOLE 2.5 MG PO TABS: 2.5000 mg | ORAL_TABLET | Freq: Every day | ORAL | 4 refills | Status: DC

## 2022-06-29 ENCOUNTER — Ambulatory Visit: Payer: 59 | Admitting: Hematology and Oncology

## 2022-07-29 ENCOUNTER — Other Ambulatory Visit: Payer: Self-pay | Admitting: *Deleted

## 2022-07-29 MED ORDER — GABAPENTIN 300 MG PO CAPS: 300.0000 mg | ORAL_CAPSULE | Freq: Every day | ORAL | 4 refills | Status: DC

## 2022-10-01 ENCOUNTER — Other Ambulatory Visit: Payer: Self-pay

## 2022-10-01 ENCOUNTER — Inpatient Hospital Stay: Payer: 59 | Attending: Hematology and Oncology | Admitting: Hematology and Oncology

## 2022-10-01 VITALS — BP 131/60 | HR 102 | Temp 97.9°F | Resp 18 | Ht 62.0 in | Wt 213.6 lb

## 2022-10-01 DIAGNOSIS — Z170 Estrogen receptor positive status [ER+]: Secondary | ICD-10-CM

## 2022-10-01 DIAGNOSIS — Z171 Estrogen receptor negative status [ER-]: Secondary | ICD-10-CM | POA: Insufficient documentation

## 2022-10-01 DIAGNOSIS — G629 Polyneuropathy, unspecified: Secondary | ICD-10-CM | POA: Diagnosis not present

## 2022-10-01 DIAGNOSIS — Z880 Allergy status to penicillin: Secondary | ICD-10-CM | POA: Diagnosis not present

## 2022-10-01 DIAGNOSIS — Z803 Family history of malignant neoplasm of breast: Secondary | ICD-10-CM | POA: Diagnosis not present

## 2022-10-01 DIAGNOSIS — Z79811 Long term (current) use of aromatase inhibitors: Secondary | ICD-10-CM | POA: Insufficient documentation

## 2022-10-01 DIAGNOSIS — R0789 Other chest pain: Secondary | ICD-10-CM | POA: Diagnosis not present

## 2022-10-01 DIAGNOSIS — Z9221 Personal history of antineoplastic chemotherapy: Secondary | ICD-10-CM | POA: Insufficient documentation

## 2022-10-01 DIAGNOSIS — C50311 Malignant neoplasm of lower-inner quadrant of right female breast: Secondary | ICD-10-CM | POA: Diagnosis not present

## 2022-10-01 DIAGNOSIS — Z923 Personal history of irradiation: Secondary | ICD-10-CM | POA: Diagnosis not present

## 2022-10-01 DIAGNOSIS — Z9013 Acquired absence of bilateral breasts and nipples: Secondary | ICD-10-CM | POA: Diagnosis not present

## 2022-10-01 NOTE — Progress Notes (Signed)
[facility]  [phone] [phone]) [phone]     ID: Rhonda Keller DOB: 11-21-1960  MR#: [id number]  CSN#:[id number]  Patient Care Team: Patient, No Pcp Per as PCP - General (General Practice) Mauro Kaufmann, RN as Oncology Nurse Navigator Rockwell Germany, RN as Oncology Nurse Navigator Erroll Luna, MD as Consulting Physician (General Surgery) Magrinat, Virgie Dad, MD (Inactive) as Consulting Physician (Oncology) Kyung Rudd, MD as Consulting Physician (Radiation Oncology) Kristeen Miss, MD as Consulting Physician (Neurosurgery) Bobbye Charleston, MD as Consulting Physician (Obstetrics and Gynecology) Robley Fries, MD as Consulting Physician (Urology) Benay Pike, MD OTHER MD: Garlon Hatchet, MD [phone] opthamology)  CHIEF COMPLAINT: 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   She started letrozole August 2021. She has some achiness from letrozole. Chest wall tightness and pain continues. She uses cymbalta and gabapentin for neuropathic pain. Since her last visit, her chest wall tightness has improved a bit.  She also thinks that the Cymbalta has cheered her mood since she was very tearful when she came last to see Dr. Jana Hakim.  She tells me that the achiness from the letrozole does not impair her activities of daily living.  She otherwise denies any changes.  Rest of the pertinent 10 point ROS reviewed and negative   COVID 19 VACCINATION STATUS: She has been reluctant to get vaccinated   HISTORY OF CURRENT ILLNESS: From the original intake note:  Rhonda Keller has a history of bilateral breast cancers. Right breast cancer was diagnosed in 01/1998, invasive ductal carcinoma, node positive, reportedly estrogen and progesterone receptor negative and treated with lumpectomy, adjuvant  radiation therapy, and 4 cycles of Taxol (Taxotere?) followed by 6 cycles of CMF. 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  The patient recalls taking tamoxifen for some time.  She also has a history of Ewing's carcinoma, which was diagnosed in 59 (age 24) and treated with surgery, radiation therapy, and chemotherapy in [place].  More recently she had routine screening mammography on 11/24/2019 showing a possible abnormality in the right breast. She underwent right diagnostic mammography with tomography and right breast ultrasonography at [facility] on 11/29/2019 showing: breast density category B; there was a 1.2 cm asymmetry in the lower-inner right breast without sonographic correlate; normal-appearing right axilla lymph nodes.  Accordingly on 11/30/2019 she proceeded to biopsy of the right breast area in question. The pathology from this procedure ([id number]) showed: invasive and in situ mammary carcinoma, grade 2, e-cadherin positive. Prognostic indicators significant for: estrogen receptor, >95% positive and progesterone receptor, >95% positive, both with strong staining intensity. Proliferation marker Ki67 at 10%. HER2 equivocal by immunohistochemistry (2+), but negative by fluorescent in situ hybridization with a signals ratio 1.46 and number per cell 1.90.  Her case was also presented at the multidisciplinary breast cancer conference on 12/06/2019. At that time a preliminary plan was proposed: Mastectomy or consideration of bilateral mastectomy, with no sentinel lymph node sampling think she must have had axillary lymph node dissection previously, likely no further radiation since she has already had radiation, but yes antiestrogens, and genetics.  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  Left breast  August 2003 ER/PR negative 1.1 cm  Rx RT   Dysphagia, idiopathic 03/29/2012   Ewing's sarcoma of bone [facility]) 03/29/2012   Dec 1985 presenting with cord compression  Rx surgery, RT, chemo   Personal history of chemotherapy    Personal history of radiation therapy     PAST SURGICAL HISTORY: Past Surgical History:  Procedure Laterality Date   BACK SURGERY     BREAST BIOPSY     BREAST LUMPECTOMY     bilateral    BREAST SURGERY     TOTAL MASTECTOMY Bilateral 11/19/2020   Procedure: RIGHT SIMPLE MASTECTOMY AND LEFT RISK REDUCING MASTECTOMY;  Surgeon: Erroll Luna, MD;  Location: [facility];  Service: General;  Laterality: Bilateral;  RNFA, PEC BLOCK; START TIME OF 8:45 AM FOR 120 MINUTES IN CORNETT IQ    FAMILY HISTORY: Family History  Problem Relation Age of Onset   Breast cancer Mother   Patient's father is currently 2 years old and her mother is 64 years old (as of 12/05/19). On her father's side, there is no cancer to her knowoledge.  The patient's mother had breast cancer but Rhonda Keller is not sure at what age.  The patient has 1 brother and 0 sisters. There is no ovarian, pancreatic, or prostate cancer in the family to the patient's knowledge.   GYNECOLOGIC HISTORY:  No LMP recorded. Patient is postmenopausal. Menarche: 18-74 years old [place] P 0 LMP "years ago," she thinks early 62's Contraceptive used in her 78's without issue. HRT never used  Hysterectomy? no BSO? 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 She is single. She lives at home alone with her two dogs, a ShiTzu and a Morphy.     ADVANCED DIRECTIVES: In place-- She has named Fredrik Rigger [phone]) as her HCPOA   HEALTH MAINTENANCE: Social History   Tobacco Use   Smoking status: Never   Smokeless tobacco: Never  Substance Use Topics   Alcohol use: Yes    Comment: every other week   Drug use: No     Colonoscopy:  2018  PAP: 2019  Bone density: date unknown   Allergies  Allergen Reactions   Penicillins Itching    Current Outpatient Medications  Medication Sig Dispense Refill   acetaminophen (TYLENOL) 500 MG tablet Take 1 tablet (500 mg total) by mouth in the morning, at noon, and at bedtime. Take with aleve 220 mg 90 tablet 6   gabapentin (NEURONTIN) 300 MG capsule Take 1 capsule (300 mg total) by mouth at bedtime. 90 capsule 4   letrozole (FEMARA) 2.5 MG tablet Take 1 tablet (2.5 mg total) by mouth daily. 90 tablet 4   naproxen sodium (ALEVE) 220 MG tablet Take 1 tablet (220 mg total) by mouth in the morning, at noon, and at bedtime. Take together with tylenol 500 mg 90 tablet 6   omeprazole (PRILOSEC) 20 MG capsule Take by mouth.     phenazopyridine (PYRIDIUM) 95 MG tablet Take 95 mg by mouth 2 (two) times daily.     No current facility-administered medications for this visit.    OBJECTIVE: white woman examined in a wheelchair  There were no vitals filed for this visit.    There is no height or weight on file to calculate BMI.   Wt Readings from Last 3 Encounters:  05/01/21 224 lb 8 oz (101.8 kg)  11/19/20 231 lb 7.7 oz (105 kg)  12/06/19 224 lb  12.8 oz (102 kg)      ECOG FS:2 - Symptomatic, <50% confined to bed  Sclerae unicteric, EOMs intact No cervical or supraclavicular adenopathy.  Bilateral breast status post bilateral mastectomies, no concerns for local recurrence.  No regional adenopathy noted on exam   LAB RESULTS:  CMP     Component Value Date/Time   NA 141 05/01/2021 1423   NA 143 03/28/2013 1238   K 4.1 05/01/2021 1423   K 4.3 03/28/2013 1238   CL 102 05/01/2021 1423   CO2 27 05/01/2021 1423   CO2 30 (H) 03/28/2013 1238   GLUCOSE 119 (H) 05/01/2021 1423   GLUCOSE 104 03/28/2013 1238   BUN 18 05/01/2021 1423   BUN 22.3 03/28/2013 1238   CREATININE 0.88 05/01/2021 1423   CREATININE 0.79 12/06/2019 1208   CREATININE 0.9 03/28/2013 1238   CALCIUM 10.7 (H)  05/01/2021 1423   CALCIUM 10.2 03/28/2013 1238   PROT 7.6 05/01/2021 1423   PROT 7.5 03/28/2013 1238   ALBUMIN 3.8 05/01/2021 1423   ALBUMIN 3.6 03/28/2013 1238   AST 20 05/01/2021 1423   AST 14 (L) 12/06/2019 1208   AST 22 03/28/2013 1238   ALT 20 05/01/2021 1423   ALT 15 12/06/2019 1208   ALT 19 03/28/2013 1238   ALKPHOS 99 05/01/2021 1423   ALKPHOS 67 03/28/2013 1238   BILITOT 0.2 (L) 05/01/2021 1423   BILITOT 0.3 12/06/2019 1208   BILITOT 0.24 03/28/2013 1238   GFRNONAA >60 05/01/2021 1423   GFRNONAA >60 12/06/2019 1208   GFRAA >60 12/06/2019 1208    No results found for: "TOTALPROTELP", "ALBUMINELP", "A1GS", "A2GS", "BETS", "BETA2SER", "GAMS", "MSPIKE", "SPEI"  Lab Results  Component Value Date   WBC 7.3 05/01/2021   NEUTROABS 5.0 05/01/2021   HGB 10.2 (L) 05/01/2021   HCT 34.1 (L) 05/01/2021   MCV 73.5 (L) 05/01/2021   PLT 215 05/01/2021    No results found for: "LABCA2"  No components found for: "TOIZTI458"  No results for input(s): "INR" in the last 168 hours.  No results found for: "LABCA2"  No results found for: "KDX833"  No results found for: "CAN125"  No results found for: "CAN153"  No results found for: "CA2729"  No components found for: "HGQUANT"  No results found for: "CEA1", "CEA" / No results found for: "CEA1", "CEA"   No results found for: "AFPTUMOR"  No results found for: "CHROMOGRNA"  No results found for: "KPAFRELGTCHN", "LAMBDASER", "KAPLAMBRATIO" (kappa/lambda light chains)  No results found for: "HGBA", "HGBA2QUANT", "HGBFQUANT", "HGBSQUAN" (Hemoglobinopathy evaluation)   Lab Results  Component Value Date   LDH 160 03/23/2012    No results found for: "IRON", "TIBC", "IRONPCTSAT" (Iron and TIBC)  No results found for: "FERRITIN"  Urinalysis No results found for: "COLORURINE", "APPEARANCEUR", "LABSPEC", "PHURINE", "GLUCOSEU", "HGBUR", "BILIRUBINUR", "KETONESUR", "PROTEINUR", "UROBILINOGEN", "NITRITE",  "LEUKOCYTESUR"   STUDIES: No results found.   ELIGIBLE FOR AVAILABLE RESEARCH PROTOCOL: AET  ASSESSMENT: 62 y.o. [place] woman status post right breast lower inner quadrant biopsy 11/30/2019 for a clinical T1c N0, stage IA invasive ductal carcinoma, grade 2, estrogen and progesterone receptor positive, with an MIB-1 of 10% and no HER-2 amplification.  (1) note in the history of present illness that the patient has had bilateral lumpectomies and bilateral breast irradiation in the past as well as a history of chemotherapy for Ewing's sarcoma and for breast cancer.  (2) genetics testing  (3) status post bilateral mastectomies 11/19/2020, showing  (a) on the right, a pT1c pN0, stage IA  invasive ductal carcinoma, grade 1, with negative margins.   (i) a total of 3 right axillary lymph nodes removed, all clear  (b) on the left, benign breast parenchyma  (4) started anastrozole 02/16/2020, switched to letrozole August 2021  (a) she took tamoxifen for uncertain period of time in the past   PLAN:   Rhonda Keller is now almost 2 years from diagnosis of breast cancer.  Chest wall pain continues, she may have gotten used to. She takes tylenol with aleve for pain control couple times a week. She started cymbalta for neuropathic pain in April 2023, and gabapentin at night.  She believes the Cymbalta may have also helped her a little bit and she is more cheerful, less tearful lately. No concerns on physical exam, exam limited however given the sitting position. Bone density ordered. She will return to clinic in 1 year or sooner as needed  Total time spent: 30 minutes  *Total Encounter Time as defined by the Centers for Medicare and Medicaid Services includes, in addition to the face-to-face time of a patient visit (documented in the note above) non-face-to-face time: obtaining and reviewing outside history, ordering and reviewing medications, tests or procedures, care coordination (communications  with other health care professionals or caregivers) and documentation in the medical record.

## 2022-10-02 ENCOUNTER — Encounter: Payer: Self-pay | Admitting: Hematology and Oncology

## 2023-01-20 ENCOUNTER — Ambulatory Visit (INDEPENDENT_AMBULATORY_CARE_PROVIDER_SITE_OTHER): Payer: 59 | Admitting: Nurse Practitioner

## 2023-01-20 ENCOUNTER — Encounter: Payer: Self-pay | Admitting: Nurse Practitioner

## 2023-01-20 VITALS — BP 122/88 | HR 86 | Temp 99.2°F | Ht 62.0 in

## 2023-01-20 DIAGNOSIS — M5412 Radiculopathy, cervical region: Secondary | ICD-10-CM

## 2023-01-20 DIAGNOSIS — Z170 Estrogen receptor positive status [ER+]: Secondary | ICD-10-CM

## 2023-01-20 DIAGNOSIS — Z85830 Personal history of malignant neoplasm of bone: Secondary | ICD-10-CM

## 2023-01-20 DIAGNOSIS — C50311 Malignant neoplasm of lower-inner quadrant of right female breast: Secondary | ICD-10-CM

## 2023-01-20 MED ORDER — TRIAMCINOLONE ACETONIDE 40 MG/ML IJ SUSP
40.0000 mg | Freq: Once | INTRAMUSCULAR | Status: AC
  Administered 2023-01-20: 40 mg via INTRAMUSCULAR

## 2023-01-20 MED ORDER — GABAPENTIN 300 MG PO CAPS: 600.0000 mg | ORAL_CAPSULE | Freq: Every day | ORAL | 0 refills | Status: DC

## 2023-01-20 NOTE — Progress Notes (Unsigned)
New Patient Visit  BP 122/88 (BP Location: Left Wrist)   Pulse 86   Temp 99.2 F (37.3 C)   Ht 5\' 2"  (1.575 m)   SpO2 95%   BMI 39.07 kg/m    Subjective:    Patient ID: Rhonda Keller, female    DOB: May 09, 1961, 62 y.o.   MRN: [id number]  CC: Chief Complaint  Patient presents with   Establish Care    NP. Est. Care, concerns with neck and right shoulder pain    HPI: Rhonda Keller is a 62 y.o. female presents for new patient visit to establish care.  Introduced to Publishing rights manager role and practice setting.  All questions answered.  Discussed provider/patient relationship and expectations.  She has a history of Ewing's sarcoma in her spine when she was 24. This required multiple surgeries. Since then, she has had weakness in her left leg along with foot drop. This has caused her to be unsteady on her feet at times. She has a walker and a wheelchair at home.   She has has a history of breast cancer 3 times. She finally had a double mastectomy, chemotherapy, and radiation. She has ongoing pain in her chest from the surgery as she feels the skin is really tight and pulling. She take gabapentin 300mg  at bedtime and duloxetine 20mg  daily, however she still has chronic pain. She is still taking letrozole 2.5mg  daily and has about 2 more years left of this. She is following with oncology regularly.   She has a history of a compressed disc C3-C4, C4-C5 that required surgery several years ago. Since then she was doing well until 5 weeks ago.    She has been having pain in her neck radiating down to her right hand for the last 5 weeks. She states that she fell and then a few days later she started having the pain. She states the pain she is feeling is similar to the pain she was having before her surgery. She saw a provider who gave her flexeril and meloxicam, however this medication does not help. She tried gabapentin 3 times a day and valium. When the pain gets bad, she will take 500mg  of  tylenol. She has an appointment with Dr. Danielle Dess in 2 days.   Depression and Anxiety Screen done:     01/20/2023    1:11 PM  Depression screen PHQ 2/9  Decreased Interest 1  Down, Depressed, Hopeless 1  PHQ - 2 Score 2  Altered sleeping 3  Tired, decreased energy 2  Change in appetite 2  Feeling bad or failure about yourself  0  Trouble concentrating 0  Moving slowly or fidgety/restless 0  Suicidal thoughts 0  PHQ-9 Score 9  Difficult doing work/chores Somewhat difficult      01/20/2023    1:13 PM  GAD 7 : Generalized Anxiety Score  Nervous, Anxious, on Edge 1  Control/stop worrying 1  Worry too much - different things 1  Trouble relaxing 0  Restless 0  Easily annoyed or irritable 0  Afraid - awful might happen 2  Total GAD 7 Score 5  Anxiety Difficulty Somewhat difficult    Past Medical History:  Diagnosis Date   Arthritis 1993   Breast cancer ([facility])    Cataract 2017   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   Left breast  August 2003 ER/PR negative 1.1 cm  Rx RT   Depression    Dysphagia, idiopathic 03/29/2012   Ewing's  sarcoma of bone [facility]) 03/29/2012   Dec 1985 presenting with cord compression  Rx surgery, RT, chemo   GERD (gastroesophageal reflux disease) 2000   Personal history of chemotherapy    Personal history of radiation therapy    Suicidal ideation     Past Surgical History:  Procedure Laterality Date   BACK SURGERY     BREAST BIOPSY     BREAST LUMPECTOMY     bilateral    BREAST SURGERY     EYE SURGERY  2017   cataract both eyes   SPINE SURGERY  1985   ewings sarcoma   TOTAL MASTECTOMY Bilateral 11/19/2020   Procedure: RIGHT SIMPLE MASTECTOMY AND LEFT RISK REDUCING MASTECTOMY;  Surgeon: Harriette Bouillon, MD;  Location: [facility];  Service: General;  Laterality: Bilateral;  RNFA, PEC BLOCK; START TIME OF 8:45 AM FOR 120 MINUTES IN CORNETT IQ    Family History  Problem Relation Age of Onset   Breast cancer Mother     Cancer Mother        breast   Depression Mother    Stroke Mother    Heart disease Father    Heart disease Paternal Grandmother    Heart disease Paternal Grandfather      Social History   Tobacco Use   Smoking status: Never   Smokeless tobacco: Never  Vaping Use   Vaping Use: Never used  Substance Use Topics   Alcohol use: Yes    Alcohol/week: 1.0 standard drink of alcohol    Types: 1 Glasses of wine per week    Comment: every other week   Drug use: Never    Current Outpatient Medications on File Prior to Visit  Medication Sig Dispense Refill   acetaminophen (TYLENOL) 500 MG tablet Take 1 tablet (500 mg total) by mouth in the morning, at noon, and at bedtime. Take with aleve 220 mg 90 tablet 6   DULoxetine (CYMBALTA) 20 MG capsule Take 20 mg by mouth daily.     letrozole (FEMARA) 2.5 MG tablet Take 1 tablet (2.5 mg total) by mouth daily. 90 tablet 4   naproxen sodium (ALEVE) 220 MG tablet Take 1 tablet (220 mg total) by mouth in the morning, at noon, and at bedtime. Take together with tylenol 500 mg 90 tablet 6   omeprazole (PRILOSEC) 20 MG capsule Take by mouth.     No current facility-administered medications on file prior to visit.     Review of Systems  Constitutional:  Positive for fatigue. Negative for fever.  HENT: Negative.    Eyes: Negative.   Respiratory: Negative.    Cardiovascular: Negative.   Gastrointestinal:  Positive for nausea. Negative for abdominal pain, constipation, diarrhea and vomiting.  Genitourinary:  Positive for urgency. Negative for frequency.  Musculoskeletal:  Positive for neck pain.  Skin: Negative.   Psychiatric/Behavioral:  Positive for dysphoric mood (at times) and sleep disturbance.       Objective:    BP 122/88 (BP Location: Left Wrist)   Pulse 86   Temp 99.2 F (37.3 C)   Ht  (1.575 m)   SpO2 95%   BMI 39.07 kg/m   Wt Readings from Last 3 Encounters:  10/01/22 213 lb 9.6 oz (96.9 kg)  05/01/21 224 lb 8 oz (101.8  kg)  11/19/20 231 lb 7.7 oz (105 kg)    BP Readings from Last 3 Encounters:  01/20/23 122/88  10/01/22 131/60  05/01/21 (!) 152/83    Physical Exam Vitals  and nursing note reviewed.  Constitutional:      General: She is not in acute distress.    Appearance: Normal appearance.  HENT:     Head: Normocephalic.  Eyes:     Conjunctiva/sclera: Conjunctivae normal.  Cardiovascular:     Rate and Rhythm: Normal rate and regular rhythm.     Pulses: Normal pulses.     Heart sounds: Normal heart sounds.  Pulmonary:     Effort: Pulmonary effort is normal.     Breath sounds: Normal breath sounds.  Abdominal:     Palpations: Abdomen is soft.     Tenderness: There is no abdominal tenderness.  Musculoskeletal:        General: Tenderness present. No swelling.     Cervical back: Normal range of motion.     Comments: Cervical spine ROM limited due to pain  Skin:    General: Skin is warm.  Neurological:     General: No focal deficit present.     Mental Status: She is alert and oriented to person, place, and time.  Psychiatric:        Mood and Affect: Mood normal.        Behavior: Behavior normal.        Thought Content: Thought content normal.        Judgment: Judgment normal.        Assessment & Plan:   Problem List Items Addressed This Visit       Nervous and Auditory   Cervical radiculopathy - Primary    Acute x5 weeks. She does have a history of C3-C4 and C4-C5 surgery. She has pain in the back of her neck that is radiating down to her right fingers. Will have her continue duloxetine  daily and increase her gabapentin to  at bedtime. Will give kenalog  IM in office today. Keep appointment with Dr. Danielle Dess in 2 days.       Relevant Medications   gabapentin (NEURONTIN) 300 MG capsule     Other   Malignant neoplasm of lower-inner quadrant of right breast of female, estrogen receptor positive (HCC)    She is currently following with oncology and taking letrozole  2.5mg  daily.       History of Ewing's sarcoma    History of Ewing's sarcoma in her back when she was 24. She still has some weakness in her left leg from prior surgery.         Follow up plan: Return in about 3 months (around 04/21/2023) for CPE.

## 2023-01-20 NOTE — Patient Instructions (Signed)
It was great to see you!  Increase your gabapentin to 2 capsules at bedtime   Keep your appointment with Dr. Danielle Dess on Friday.   Let's follow-up in 3 months, sooner if you have concerns.  If a referral was placed today, you will be contacted for an appointment. Please note that routine referrals can sometimes take up to 3-4 weeks to process. Please call our office if you haven't heard anything after this time frame.  Take care,  Rodman Pickle, NP

## 2023-01-21 ENCOUNTER — Encounter: Payer: Self-pay | Admitting: Nurse Practitioner

## 2023-01-21 DIAGNOSIS — M5412 Radiculopathy, cervical region: Secondary | ICD-10-CM | POA: Insufficient documentation

## 2023-01-21 DIAGNOSIS — Z85830 Personal history of malignant neoplasm of bone: Secondary | ICD-10-CM | POA: Insufficient documentation

## 2023-01-21 NOTE — Assessment & Plan Note (Addendum)
Acute x5 weeks. She does have a history of C3-C4 and C4-C5 surgery. She has pain in the back of her neck that is radiating down to her right fingers. Will have her continue duloxetine  daily and increase her gabapentin to  at bedtime. Will give kenalog  IM in office today. Keep appointment with Dr. Danielle Dess in 2 days.

## 2023-01-21 NOTE — Assessment & Plan Note (Signed)
She is currently following with oncology and taking letrozole 2.5mg  daily.

## 2023-01-21 NOTE — Assessment & Plan Note (Signed)
History of Ewing's sarcoma in her back when she was 24. She still has some weakness in her left leg from prior surgery.

## 2023-01-29 ENCOUNTER — Other Ambulatory Visit: Payer: Self-pay | Admitting: Neurological Surgery

## 2023-01-29 DIAGNOSIS — M5412 Radiculopathy, cervical region: Secondary | ICD-10-CM

## 2023-02-11 ENCOUNTER — Ambulatory Visit
Admission: RE | Admit: 2023-02-11 | Discharge: 2023-02-11 | Disposition: A | Discharge status: Home / Self Care | Payer: 59 | Source: Ambulatory Visit | Attending: Neurological Surgery | Admitting: Neurological Surgery

## 2023-02-11 DIAGNOSIS — M5412 Radiculopathy, cervical region: Secondary | ICD-10-CM

## 2023-04-22 ENCOUNTER — Encounter: Payer: 59 | Admitting: Nurse Practitioner

## 2023-05-18 ENCOUNTER — Telehealth: Payer: Self-pay | Admitting: Nurse Practitioner

## 2023-05-18 NOTE — Telephone Encounter (Signed)
05/18/23 - Pt called asking to know if she can get the same pain shot she got the last time she had an appt, also she would like to know if it is possible for the nurse to come give her the pain shot in her car at the car park instead of having to come in since she's on a wheel chair and doesn't have anyone to assist her in to the building. She wants a call back at [phone].

## 2023-05-19 ENCOUNTER — Telehealth (INDEPENDENT_AMBULATORY_CARE_PROVIDER_SITE_OTHER): Payer: 59 | Admitting: Nurse Practitioner

## 2023-05-19 ENCOUNTER — Encounter: Payer: Self-pay | Admitting: Nurse Practitioner

## 2023-05-19 VITALS — Ht 62.0 in | Wt 220.0 lb

## 2023-05-19 DIAGNOSIS — M5412 Radiculopathy, cervical region: Secondary | ICD-10-CM | POA: Diagnosis not present

## 2023-05-19 MED ORDER — TRIAMCINOLONE ACETONIDE 40 MG/ML IJ SUSP
40.0000 mg | Freq: Once | INTRAMUSCULAR | Status: AC
  Administered 2023-05-20: 40 mg via INTRAMUSCULAR
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
## 2023-05-19 NOTE — Patient Instructions (Signed)
It was great to see you!  You can use heat and ice on your shoulder  Do stretches daily   Call when you can get a ride and schedule a nurse visit for the kenolog injection  Let's follow-up if your symptoms worsen or don't improve.   Take care,  Rodman Pickle, NP

## 2023-05-19 NOTE — Progress Notes (Signed)
[facility] PRIMARY CARE [facility] [address] [place] [address] Dept: [phone] Dept Fax: [phone]  Virtual Video Visit  I connected with Rhonda Keller on 05/19/23 at  3:00 PM EDT by a video enabled telemedicine application and verified that I am speaking with the correct person using two identifiers.  Location patient: Home Location provider: Clinic Persons participating in the virtual visit: Patient; Rodman Pickle, NP; Malena Peer, CMA  I discussed the limitations of evaluation and management by telemedicine and the availability of in person appointments. The patient expressed understanding and agreed to proceed.  Chief Complaint  Patient presents with   Shoulder Pain    Right shoulder pain 2 weeks    SUBJECTIVE:  HPI: Rhonda Keller is a 62 y.o. female who presents with right shoulder pain for 2 weeks.  She states that she was having this back in April and got a Kenalog shot which helped with the pain that she was having.  It has been good for the last 4 months.  She states that a few weeks ago she took a driving class in [place] for a handicap vehicle and notes that she was using her arms differently than she was used to.  She states that the pain goes from her shoulder and radiates down to her hand.  She has been taking tramadol that she has had from a prior prescription.  She states that laying down helps the pain.  She is interested in another Kenalog injection.  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   Dysphagia, idiopathic 03/29/2012    Past Surgical  History:  Procedure Laterality Date   BACK SURGERY     BREAST BIOPSY     BREAST LUMPECTOMY     bilateral    BREAST SURGERY     EYE SURGERY  2017   cataract both eyes   SPINE SURGERY  1985   ewings sarcoma   TOTAL MASTECTOMY Bilateral 11/19/2020   Procedure: RIGHT SIMPLE MASTECTOMY AND LEFT RISK REDUCING MASTECTOMY;  Surgeon: Harriette Bouillon, MD;  Location: [facility];  Service: General;  Laterality: Bilateral;  RNFA, PEC BLOCK; START TIME OF 8:45 AM FOR 120 MINUTES IN CORNETT IQ    Family History  Problem Relation Age of Onset   Breast cancer Mother    Cancer Mother        breast   Depression Mother    Stroke Mother    Heart disease Father    Heart disease Paternal Grandmother    Heart disease Paternal Grandfather     Social History   Tobacco Use   Smoking status: Never   Smokeless tobacco: Never  Vaping Use   Vaping status: Never Used  Substance Use Topics   Alcohol use: Yes    Alcohol/week: 1.0 standard drink of alcohol    Types: 1 Glasses of wine per week    Comment: every other week   Drug use: Never     Current Outpatient Medications:    acetaminophen (TYLENOL) 500 MG tablet, Take 1 tablet (500 mg total) by mouth in the morning, at noon, and at bedtime. Take  with aleve 220 mg, Disp: 90 tablet, Rfl: 6   DULoxetine (CYMBALTA) 20 MG capsule, Take 20 mg by mouth daily., Disp: , Rfl:    gabapentin (NEURONTIN) 300 MG capsule, Take 2 capsules (600 mg total) by mouth at bedtime., Disp: 90 capsule, Rfl: 0   letrozole (FEMARA) 2.5 MG tablet, Take 1 tablet (2.5 mg total) by mouth daily., Disp: 90 tablet, Rfl: 4   naproxen sodium (ALEVE) 220 MG tablet, Take 1 tablet (220 mg total) by mouth in the morning, at noon, and at bedtime. Take together with tylenol 500 mg, Disp: 90 tablet, Rfl: 6   omeprazole (PRILOSEC) 20 MG capsule, Take by mouth., Disp: , Rfl:    traMADol (ULTRAM) 50 MG tablet, Take 50 mg by mouth as needed., Disp: , Rfl:   Current  Facility-Administered Medications:    [START ON 05/20/2023] triamcinolone acetonide (KENALOG-40) injection 40 mg, 40 mg, Intramuscular, Once,   Allergies  Allergen Reactions   Penicillins Itching    ROS: See pertinent positives and negatives per HPI.  OBSERVATIONS/OBJECTIVE:  VITALS per patient if applicable: Today's Vitals   05/19/23 1451  Weight: 220 lb (99.8 kg)  Height: 5\' 2"  (1.575 m)  PainSc: 6   PainLoc: Shoulder   Body mass index is 40.24 kg/m.    GENERAL: Alert and oriented. Appears well and in no acute distress.  HEENT: Atraumatic. Conjunctiva clear. No obvious abnormalities on inspection of external nose and ears.  NECK: Normal movements of the head and neck.  LUNGS: On inspection, no signs of respiratory distress. Breathing rate appears normal. No obvious gross SOB, gasping or wheezing, and no conversational dyspnea.  CV: No obvious cyanosis.  MS: Moves all visible extremities without noticeable abnormality.  PSYCH/NEURO: Pleasant and cooperative. No obvious depression or anxiety. Speech and thought processing grossly intact.  ASSESSMENT AND PLAN:  Problem List Items Addressed This Visit       Nervous and Auditory   Cervical radiculopathy - Primary    Chronic, ongoing.  She had a MRI of her cervical spine in 01/2023 which showed C3-4 there is a minimal broad-based disc bulge and mild bilateral facet arthropathy.  She states that she saw Dr. Danielle Dess who said that she did not need surgery at this point.  She flared up her shoulder and radiating down to her hand a few weeks ago after driving a handicap car.  Will have her come in for a Kenalog 40 mg IM shot at a nurse visit when she is able to come.  She can also continue to take the tramadol as needed and use heat and ice to her shoulder.  Recommend that she do stretches daily.  Follow-up at next scheduled visit.      Relevant Medications   triamcinolone acetonide (KENALOG-40) injection 40 mg (Start on  05/20/2023 12:00 PM)     I discussed the assessment and treatment plan with the patient. The patient was provided an opportunity to ask questions and all were answered. The patient agreed with the plan and demonstrated an understanding of the instructions.   The patient was advised to call back or seek an in-person evaluation if the symptoms worsen or if the condition fails to improve as anticipated.   Gerre Scull, NP

## 2023-05-19 NOTE — Telephone Encounter (Signed)
I called and spoke with patient and notified her of below message and she will schedule an appointment

## 2023-05-20 ENCOUNTER — Ambulatory Visit (INDEPENDENT_AMBULATORY_CARE_PROVIDER_SITE_OTHER): Payer: 59

## 2023-05-20 DIAGNOSIS — M5412 Radiculopathy, cervical region: Secondary | ICD-10-CM | POA: Diagnosis not present

## 2023-05-20 NOTE — Progress Notes (Signed)
Patient presents for Kenalog 40mg  injection. Injection placed in right deltoid region by Malena Peer, CMA. Patient tolerated procedure well with no concerns.

## 2023-06-22 ENCOUNTER — Other Ambulatory Visit: Payer: Self-pay | Admitting: *Deleted

## 2023-06-22 MED ORDER — LETROZOLE 2.5 MG PO TABS: 2.5000 mg | ORAL_TABLET | Freq: Every day | ORAL | 4 refills | Status: DC

## 2023-08-02 ENCOUNTER — Other Ambulatory Visit: Payer: Self-pay | Admitting: Hematology and Oncology

## 2023-08-06 ENCOUNTER — Other Ambulatory Visit: Payer: Self-pay | Admitting: Hematology and Oncology

## 2023-09-14 ENCOUNTER — Telehealth: Payer: Self-pay | Admitting: Hematology and Oncology

## 2023-09-14 NOTE — Telephone Encounter (Signed)
Spoke with patient confirming upcoming appointment change

## 2023-09-24 ENCOUNTER — Other Ambulatory Visit: Payer: Self-pay | Admitting: Nurse Practitioner

## 2023-09-25 MED ORDER — DULOXETINE HCL 20 MG PO CPEP
20.0000 mg | ORAL_CAPSULE | Freq: Every day | ORAL | 0 refills | Status: DC
  Filled 2023-09-25: qty 90, 90d supply, fill #0

## 2023-09-25 MED ORDER — TRAMADOL HCL 50 MG PO TABS
50.0000 mg | ORAL_TABLET | ORAL | 0 refills | Status: AC | PRN
  Filled 2023-09-25: qty 30, fill #0

## 2023-09-26 ENCOUNTER — Other Ambulatory Visit: Payer: Self-pay

## 2023-10-04 ENCOUNTER — Ambulatory Visit: Payer: 59 | Admitting: Hematology and Oncology

## 2023-10-11 ENCOUNTER — Other Ambulatory Visit: Payer: Self-pay | Admitting: Nurse Practitioner

## 2023-10-11 ENCOUNTER — Inpatient Hospital Stay: Payer: 59 | Admitting: Hematology and Oncology

## 2023-10-11 MED ORDER — DULOXETINE HCL 20 MG PO CPEP: 20.0000 mg | ORAL_CAPSULE | Freq: Every day | ORAL | 0 refills | Status: DC

## 2023-10-11 NOTE — Telephone Encounter (Signed)
 Copied from CRM [id number]. Topic: Clinical - Medication Refill >> Oct 11, 2023  2:14 PM Joanell NOVAK wrote: Most Recent Primary Care Visit:  Provider: MCELWEE, LAUREN A  Department: [facility]  Visit Type: MYCHART VIDEO VISIT  Date: 05/19/2023  Medication: DULoxetine  (CYMBALTA ) 20 MG capsule  Has the patient contacted their pharmacy? Yes (Agent: If no, request that the patient contact the pharmacy for the refill. If patient does not wish to contact the pharmacy document the reason why and proceed with request.) (Agent: If yes, when and what did the pharmacy advise?)  Is this the correct pharmacy for this prescription? Yes If no, delete pharmacy and type the correct one.  This is the patient's preferred pharmacy:  [id number] - [place], [place] - [address] [place] [address] Phone: [phone] Fax: [phone]   Has the prescription been filled recently? No  Is the patient out of the medication? No  Has the patient been seen for an appointment in the last year OR does the patient have an upcoming appointment? Yes  Can we respond through MyChart? Yes  Agent: Please be advised that Rx refills may take up to 3 business days. We ask that you follow-up with your pharmacy.

## 2023-10-11 NOTE — Telephone Encounter (Signed)
 LOV 05/19/23 FOV  none scheduled.   Please review and advise.   Thanks. Dm/cma

## 2023-10-11 NOTE — Telephone Encounter (Signed)
 Patient notified VIA phone. Dm/cma

## 2023-11-05 ENCOUNTER — Encounter: Payer: Self-pay | Admitting: Nurse Practitioner

## 2023-11-08 ENCOUNTER — Telehealth: Payer: Self-pay

## 2023-11-08 ENCOUNTER — Telehealth: Payer: Self-pay | Admitting: Hematology and Oncology

## 2023-11-08 NOTE — Telephone Encounter (Signed)
 Spoke with patient confirming upcoming appointment

## 2023-11-08 NOTE — Telephone Encounter (Signed)
 Spoke with patient about appointment on 2/11.  Patient stated that she called earlier this morning to have appointment rescheduled due to weather.  I sent message to scheduling to have patient rescheduled.

## 2023-11-09 ENCOUNTER — Inpatient Hospital Stay: Payer: 59 | Admitting: Hematology and Oncology

## 2023-11-29 ENCOUNTER — Telehealth: Payer: Self-pay

## 2023-11-29 NOTE — Telephone Encounter (Signed)
 Left message for patient about upcoming appointment on 11/30/23

## 2023-11-30 ENCOUNTER — Inpatient Hospital Stay: Payer: 59 | Attending: Hematology and Oncology | Admitting: Hematology and Oncology

## 2023-11-30 ENCOUNTER — Inpatient Hospital Stay

## 2023-11-30 VITALS — BP 142/56 | HR 100 | Temp 97.6°F | Resp 18

## 2023-11-30 DIAGNOSIS — G629 Polyneuropathy, unspecified: Secondary | ICD-10-CM | POA: Diagnosis not present

## 2023-11-30 DIAGNOSIS — Z1721 Progesterone receptor positive status: Secondary | ICD-10-CM | POA: Insufficient documentation

## 2023-11-30 DIAGNOSIS — Z9013 Acquired absence of bilateral breasts and nipples: Secondary | ICD-10-CM | POA: Insufficient documentation

## 2023-11-30 DIAGNOSIS — Z818 Family history of other mental and behavioral disorders: Secondary | ICD-10-CM | POA: Insufficient documentation

## 2023-11-30 DIAGNOSIS — Z79811 Long term (current) use of aromatase inhibitors: Secondary | ICD-10-CM | POA: Diagnosis not present

## 2023-11-30 DIAGNOSIS — Z170 Estrogen receptor positive status [ER+]: Secondary | ICD-10-CM | POA: Diagnosis not present

## 2023-11-30 DIAGNOSIS — Z79899 Other long term (current) drug therapy: Secondary | ICD-10-CM | POA: Insufficient documentation

## 2023-11-30 DIAGNOSIS — Z823 Family history of stroke: Secondary | ICD-10-CM | POA: Diagnosis not present

## 2023-11-30 DIAGNOSIS — Z880 Allergy status to penicillin: Secondary | ICD-10-CM | POA: Diagnosis not present

## 2023-11-30 DIAGNOSIS — Z9221 Personal history of antineoplastic chemotherapy: Secondary | ICD-10-CM | POA: Insufficient documentation

## 2023-11-30 DIAGNOSIS — Z923 Personal history of irradiation: Secondary | ICD-10-CM | POA: Diagnosis not present

## 2023-11-30 DIAGNOSIS — N6489 Other specified disorders of breast: Secondary | ICD-10-CM | POA: Insufficient documentation

## 2023-11-30 DIAGNOSIS — Z803 Family history of malignant neoplasm of breast: Secondary | ICD-10-CM | POA: Insufficient documentation

## 2023-11-30 DIAGNOSIS — C50311 Malignant neoplasm of lower-inner quadrant of right female breast: Secondary | ICD-10-CM

## 2023-11-30 DIAGNOSIS — Z1722 Progesterone receptor negative status: Secondary | ICD-10-CM | POA: Diagnosis not present

## 2023-11-30 DIAGNOSIS — K219 Gastro-esophageal reflux disease without esophagitis: Secondary | ICD-10-CM | POA: Insufficient documentation

## 2023-11-30 DIAGNOSIS — Z8249 Family history of ischemic heart disease and other diseases of the circulatory system: Secondary | ICD-10-CM | POA: Insufficient documentation

## 2023-11-30 LAB — CMP (CANCER CENTER ONLY)
ALT: 19 U/L (ref 0–44)
AST: 22 U/L (ref 15–41)
Albumin: 4.2 g/dL (ref 3.5–5.0)
Alkaline Phosphatase: 96 U/L (ref 38–126)
Anion gap: 7 (ref 5–15)
BUN: 15 mg/dL (ref 8–23)
CO2: 29 mmol/L (ref 22–32)
Calcium: 10.2 mg/dL (ref 8.9–10.3)
Chloride: 102 mmol/L (ref 98–111)
Creatinine: 0.72 mg/dL (ref 0.44–1.00)
GFR, Estimated: >60 mL/min (ref >60)
Glucose, Bld: 124 mg/dL — ABNORMAL HIGH (ref 70–99)
Potassium: 4.3 mmol/L (ref 3.5–5.1)
Sodium: 138 mmol/L (ref 135–145)
Total Bilirubin: 0.3 mg/dL (ref 0.0–1.2)
Total Protein: 7.7 g/dL (ref 6.5–8.1)

## 2023-11-30 LAB — CBC WITH DIFFERENTIAL/PLATELET
Abs Immature Granulocytes: 0.01 10*3/uL (ref 0.00–0.07)
Basophils Absolute: 0 10*3/uL (ref 0.0–0.1)
Basophils Relative: 1 %
Eosinophils Absolute: 0.2 10*3/uL (ref 0.0–0.5)
Eosinophils Relative: 2 %
HCT: 40.6 % (ref 36.0–46.0)
Hemoglobin: 12.2 g/dL (ref 12.0–15.0)
Immature Granulocytes: 0 %
Lymphocytes Relative: 24 %
Lymphs Abs: 1.6 10*3/uL (ref 0.7–4.0)
MCH: 23.3 pg — ABNORMAL LOW (ref 26.0–34.0)
MCHC: 30 g/dL (ref 30.0–36.0)
MCV: 77.6 fL — ABNORMAL LOW (ref 80.0–100.0)
Monocytes Absolute: 0.5 10*3/uL (ref 0.1–1.0)
Monocytes Relative: 7 %
Neutro Abs: 4.3 10*3/uL (ref 1.7–7.7)
Neutrophils Relative %: 66 %
Platelets: 206 10*3/uL (ref 150–400)
RBC: 5.23 MIL/uL — ABNORMAL HIGH (ref 3.87–5.11)
RDW: 14.2 % (ref 11.5–15.5)
WBC: 6.6 10*3/uL (ref 4.0–10.5)
nRBC: 0 % (ref 0.0–0.2)

## 2023-11-30 MED ORDER — GABAPENTIN 300 MG PO CAPS: 300.0000 mg | ORAL_CAPSULE | Freq: Every day | ORAL | 3 refills | Status: DC

## 2023-11-30 MED ORDER — DULOXETINE HCL 20 MG PO CPEP: 20.0000 mg | ORAL_CAPSULE | Freq: Every day | ORAL | 0 refills | Status: DC

## 2023-11-30 MED ORDER — LETROZOLE 2.5 MG PO TABS: 2.5000 mg | ORAL_TABLET | Freq: Every day | ORAL | 4 refills | Status: AC

## 2023-11-30 NOTE — Progress Notes (Signed)
 [facility]  [phone] [phone]     ID: Rhonda Keller DOB: 09-May-1961  MR#: [id number]  WGN#:[id number]  Patient Care Team: Gerre Scull, NP as PCP - General (Internal Medicine) Pershing Proud, RN as Oncology Nurse Navigator Donnelly Angelica, RN as Oncology Nurse Navigator Harriette Bouillon, MD as Consulting Physician (General Surgery) Magrinat, Valentino Hue, MD (Inactive) as Consulting Physician (Oncology) Dorothy Puffer, MD as Consulting Physician (Radiation Oncology) Barnett Abu, MD as Consulting Physician (Neurosurgery) Carrington Clamp, MD as Consulting Physician (Obstetrics and Gynecology) Noel Christmas, MD as Consulting Physician (Urology) Rachel Moulds, MD OTHER MD: Adline Peals, MD [phone] opthamology)  CHIEF COMPLAINT: estrogen receptor positive breast cancer (s/p bilateral mastectomies)  CURRENT TREATMENT: Letrozole  INTERVAL HISTORY:  Discussed the use of AI scribe software for clinical note transcription with the patient, who gave verbal consent to proceed.  History of Present Illness    Rhonda Keller is a 63 year old female with breast cancer who presents for follow-up regarding ongoing neuropathy and medication management.  She has a history of breast cancer diagnosed in March 2021, with a mastectomy performed in 2022. She has been on letrozole since August 2021 and is expected to continue this medication until May 2026. She experiences ongoing neuropathy and pain.  She is currently taking letrozole, gabapentin at night, Cymbalta (duloxetine), and over-the-counter omeprazole. She is running low on gabapentin and Cymbalta and needs refills. Gabapentin is taken once at night, and Cymbalta is used for nerve pain and depression. She also uses tramadol occasionally for pain, particularly during cold weather, which she suspects exacerbates her symptoms. She has a few tramadol pills left and plans to consult with her provider for  a refill.  She has been careful to avoid falls and has not experienced any recently. She has not had a recent bone density test and is open to scheduling one. She reports frequent urination, which she attributes to post-menopausal changes, and states that her bowel movements are regular.    COVID 19 VACCINATION STATUS: She has been reluctant to get vaccinated   HISTORY OF CURRENT ILLNESS: From the original intake note:  Rhonda Keller has a history of bilateral breast cancers. Right breast cancer was diagnosed in 01/1998, invasive ductal carcinoma, node positive, reportedly estrogen and progesterone receptor negative and treated with lumpectomy, adjuvant radiation therapy, and 4 cycles of Taxol (Taxotere?) followed by 6 cycles of CMF. 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  The patient recalls taking tamoxifen for some time.  She also has a history of Ewing's carcinoma, which was diagnosed in 13 (age 24) and treated with surgery, radiation therapy, and chemotherapy in [place].  More recently she had routine screening mammography on 11/24/2019 showing a possible abnormality in the right breast. She underwent right diagnostic mammography with tomography and right breast ultrasonography at [facility] on 11/29/2019 showing: breast density category B; there was a 1.2 cm asymmetry in the lower-inner right breast without sonographic correlate; normal-appearing right axilla lymph nodes.  Accordingly on 11/30/2019 she proceeded to biopsy of the right breast area in question. The pathology from this procedure ([id number]) showed: invasive and in situ mammary carcinoma, grade 2, e-cadherin positive. Prognostic indicators significant for: estrogen receptor, >95% positive and progesterone receptor, >95% positive, both with strong staining intensity. Proliferation marker Ki67 at 10%. HER2 equivocal by immunohistochemistry (2+), but negative  by fluorescent in situ hybridization with a signals  ratio 1.46 and number per cell 1.90.  Her case was also presented at the multidisciplinary breast cancer conference on 12/06/2019. At that time a preliminary plan was proposed: Mastectomy or consideration of bilateral mastectomy, with no sentinel lymph node sampling think she must have had axillary lymph node dissection previously, likely no further radiation since she has already had radiation, but yes antiestrogens, and genetics.  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   Dec 1985 presenting with cord compression  Rx surgery, RT, chemo   GERD (gastroesophageal reflux disease) 2000   Personal history of chemotherapy    Personal history of radiation therapy    Suicidal ideation     PAST SURGICAL HISTORY: Past Surgical History:  Procedure Laterality Date   BACK SURGERY     BREAST BIOPSY     BREAST LUMPECTOMY     bilateral    BREAST SURGERY     EYE SURGERY  2017   cataract both eyes   SPINE SURGERY  1985   ewings sarcoma   TOTAL MASTECTOMY Bilateral 11/19/2020   Procedure: RIGHT SIMPLE MASTECTOMY AND LEFT RISK REDUCING MASTECTOMY;  Surgeon: Harriette Bouillon, MD;  Location: [facility];  Service: General;  Laterality: Bilateral;  RNFA, PEC BLOCK; START TIME OF 8:45 AM FOR 120 MINUTES IN CORNETT IQ    FAMILY HISTORY: Family History  Problem Relation Age of Onset   Breast cancer Mother    Cancer Mother        breast   Depression Mother    Stroke Mother    Heart disease Father    Heart disease Paternal Grandmother    Heart disease Paternal Grandfather   Patient's father is  currently 59 years old and her mother is 51 years old (as of 12/05/19). On her father's side, there is no cancer to her knowoledge.  The patient's mother had breast cancer but Ms. Blakeman is not sure at what age.  The patient has 1 brother and 0 sisters. There is no ovarian, pancreatic, or prostate cancer in the family to the patient's knowledge.   GYNECOLOGIC HISTORY:  No LMP recorded. Patient is postmenopausal. Menarche: 60-26 years old GX P 0 LMP "years ago," she thinks early 58's Contraceptive used in her 22's without issue. HRT never used  Hysterectomy? no BSO? 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 She is single. She lives at home alone with her two dogs, a ShiTzu and a Morphy.     ADVANCED DIRECTIVES: In place-- She has named Ricka Burdock [phone]) as her HCPOA   HEALTH MAINTENANCE: Social History   Tobacco Use   Smoking status: Never   Smokeless tobacco: Never  Vaping Use   Vaping status: Never Used  Substance Use Topics   Alcohol use: Yes    Alcohol/week: 1.0 standard drink of alcohol    Types: 1 Glasses of wine per week    Comment: every other week   Drug use: Never     Colonoscopy: 2018  PAP: 2019  Bone density: date unknown   Allergies  Allergen  Reactions   Penicillins Itching    Current Outpatient Medications  Medication Sig Dispense Refill   acetaminophen (TYLENOL) 500 MG tablet Take 1 tablet (500 mg total) by mouth in the morning, at noon, and at bedtime. Take with aleve 220 mg 90 tablet 6   DULoxetine (CYMBALTA) 20 MG capsule Take 1 capsule (20 mg total) by mouth daily. 90 capsule 0   gabapentin (NEURONTIN) 300 MG capsule TAKE 1 CAPSULE(300 MG) BY MOUTH AT BEDTIME 90 capsule 0   letrozole (FEMARA) 2.5 MG tablet Take 1 tablet (2.5 mg total) by mouth daily. 90 tablet 4   naproxen sodium (ALEVE) 220 MG tablet Take 1 tablet (220 mg total) by mouth in the morning, at noon, and at bedtime. Take together with  tylenol 500 mg 90 tablet 6   omeprazole (PRILOSEC) 20 MG capsule Take by mouth.     traMADol (ULTRAM) 50 MG tablet Take 1 tablet (50 mg total) by mouth as needed. 30 tablet 0   No current facility-administered medications for this visit.    OBJECTIVE: white woman examined in a wheelchair  There were no vitals filed for this visit.    There is no height or weight on file to calculate BMI.   Wt Readings from Last 3 Encounters:  05/19/23 220 lb (99.8 kg)  10/01/22 213 lb 9.6 oz (96.9 kg)  05/01/21 224 lb 8 oz (101.8 kg)      ECOG FS:2 - Symptomatic, <50% confined to bed  Sclerae unicteric, EOMs intact No cervical or supraclavicular adenopathy.  Bilateral breast status post bilateral mastectomies, no concerns for local recurrence.  No regional adenopathy noted on exam Chest: CTA bilaterally Heart: RRR No LE edema  LAB RESULTS:  CMP     Component Value Date/Time   NA 141 05/01/2021 1423   NA 143 03/28/2013 1238   K 4.1 05/01/2021 1423   K 4.3 03/28/2013 1238   CL 102 05/01/2021 1423   CO2 27 05/01/2021 1423   CO2 30 (H) 03/28/2013 1238   GLUCOSE 119 (H) 05/01/2021 1423   GLUCOSE 104 03/28/2013 1238   BUN 18 05/01/2021 1423   BUN 22.3 03/28/2013 1238   CREATININE 0.88 05/01/2021 1423   CREATININE 0.79 12/06/2019 1208   CREATININE 0.9 03/28/2013 1238   CALCIUM 10.7 (H) 05/01/2021 1423   CALCIUM 10.2 03/28/2013 1238   PROT 7.6 05/01/2021 1423   PROT 7.5 03/28/2013 1238   ALBUMIN 3.8 05/01/2021 1423   ALBUMIN 3.6 03/28/2013 1238   AST 20 05/01/2021 1423   AST 14 (L) 12/06/2019 1208   AST 22 03/28/2013 1238   ALT 20 05/01/2021 1423   ALT 15 12/06/2019 1208   ALT 19 03/28/2013 1238   ALKPHOS 99 05/01/2021 1423   ALKPHOS 67 03/28/2013 1238   BILITOT 0.2 (L) 05/01/2021 1423   BILITOT 0.3 12/06/2019 1208   BILITOT 0.24 03/28/2013 1238   GFRNONAA >60 05/01/2021 1423   GFRNONAA >60 12/06/2019 1208   GFRAA >60 12/06/2019 1208    No results found for: "TOTALPROTELP",  "ALBUMINELP", "A1GS", "A2GS", "BETS", "BETA2SER", "GAMS", "MSPIKE", "SPEI"  Lab Results  Component Value Date   WBC 7.3 05/01/2021   NEUTROABS 5.0 05/01/2021   HGB 10.2 (L) 05/01/2021   HCT 34.1 (L) 05/01/2021   MCV 73.5 (L) 05/01/2021   PLT 215 05/01/2021    No results found for: "LABCA2"  No components found for: "YNWGNF621"  No results for input(s): "INR" in the last 168 hours.  No results found for: "LABCA2"  No results found for: "CAN199"  No results found for: "CAN125"  No results found for: "CAN153"  No results found for: "CA2729"  No components found for: "HGQUANT"  No results found for: "CEA1", "CEA" / No results found for: "CEA1", "CEA"   No results found for: "AFPTUMOR"  No results found for: "CHROMOGRNA"  No results found for: "KPAFRELGTCHN", "LAMBDASER", "KAPLAMBRATIO" (kappa/lambda light chains)  No results found for: "HGBA", "HGBA2QUANT", "HGBFQUANT", "HGBSQUAN" (Hemoglobinopathy evaluation)   Lab Results  Component Value Date   LDH 160 03/23/2012    No results found for: "IRON", "TIBC", "IRONPCTSAT" (Iron and TIBC)  No results found for: "FERRITIN"  Urinalysis No results found for: "COLORURINE", "APPEARANCEUR", "LABSPEC", "PHURINE", "GLUCOSEU", "HGBUR", "BILIRUBINUR", "KETONESUR", "PROTEINUR", "UROBILINOGEN", "NITRITE", "LEUKOCYTESUR"   STUDIES: No results found.   ELIGIBLE FOR AVAILABLE RESEARCH PROTOCOL: AET  ASSESSMENT: 63 y.o. [place] woman status post right breast lower inner quadrant biopsy 11/30/2019 for a clinical T1c N0, stage IA invasive ductal carcinoma, grade 2, estrogen and progesterone receptor positive, with an MIB-1 of 10% and no HER-2 amplification.  (1) note in the history of present illness that the patient has had bilateral lumpectomies and bilateral breast irradiation in the past as well as a history of chemotherapy for Ewing's sarcoma and for breast cancer.  (2) genetics testing  (3) status post bilateral  mastectomies 11/19/2020, showing  (a) on the right, a pT1c pN0, stage IA invasive ductal carcinoma, grade 1, with negative margins.   (i) a total of 3 right axillary lymph nodes removed, all clear  (b) on the left, benign breast parenchyma  (4) started anastrozole 02/16/2020, switched to letrozole August 2021  (a) she took tamoxifen for uncertain period of time in the past   PLAN:  Breast Cancer Post-mastectomy with ongoing chest wall pain. On Letrozole since August 2021 with no reported side effects. -Continue Letrozole until May 2026. -Refill Letrozole prescription to the same pharmacy.  Neuropathic Pain Ongoing despite Cymbalta and Gabapentin. -Refill Gabapentin and Cymbalta prescriptions to the same pharmacy.  Bone Health On Letrozole which can affect bone density. -Order bone density scan at [facility].  General Health Maintenance -Order routine blood work. -Schedule follow-up appointment in one year.  Total time spent: 30 minutes  *Total Encounter Time as defined by the Centers for Medicare and Medicaid Services includes, in addition to the face-to-face time of a patient visit (documented in the note above) non-face-to-face time: obtaining and reviewing outside history, ordering and reviewing medications, tests or procedures, care coordination (communications with other health care professionals or caregivers) and documentation in the medical record.

## 2024-01-12 ENCOUNTER — Other Ambulatory Visit: Payer: Self-pay | Admitting: Nurse Practitioner

## 2024-01-12 NOTE — Telephone Encounter (Signed)
 Requesting: DULOXETINE DR 20MG  CAPSULES  Last Visit: 01/20/2023 Next Visit: Visit date not found Last Refill: 11/30/2023 by Dr. Bernis Brisker [place]  Please Advise

## 2024-01-12 NOTE — Telephone Encounter (Signed)
 I spoke with the patient trying to schedule her a follow up appoint for any further refill. Pt stated she will call back to schedule her appt once she look at her calendar

## 2024-05-08 ENCOUNTER — Other Ambulatory Visit: Payer: Self-pay | Admitting: *Deleted

## 2024-05-08 ENCOUNTER — Encounter: Payer: Self-pay | Admitting: Hematology and Oncology

## 2024-05-08 MED ORDER — DULOXETINE HCL 20 MG PO CPEP: 20.0000 mg | ORAL_CAPSULE | Freq: Every day | ORAL | 1 refills | Status: AC

## 2024-05-09 ENCOUNTER — Other Ambulatory Visit: Payer: Self-pay | Admitting: Nurse Practitioner

## 2024-06-06 ENCOUNTER — Ambulatory Visit: Payer: Self-pay

## 2024-06-06 NOTE — Telephone Encounter (Signed)
 FYI Only or Action Required?: Action required by provider: patient is wheelchair dependent and will need help into the office on 06/07/2024.  Patient was last seen in primary care on 05/19/2023 by Nedra Tinnie LABOR, NP.  Called Nurse Triage reporting Shoulder Pain.  Symptoms began a week ago.  Interventions attempted: OTC medications: tylenol , advil  and Prescription medications: tramadol .  Symptoms are: unchanged.  Triage Disposition: See PCP When Office is Open (Within 3 Days)  Patient/caregiver understands and will follow disposition?: Yes  Copied from CRM [id number]. Topic: Clinical - Red Word Triage >> Jun 06, 2024 12:27 PM Taleah C wrote: Red Word that prompted transfer to Nurse Triage: right shoulder pain, when flared up its hard to take breaths. Reason for Disposition  [1] MODERATE pain (e.g., interferes with normal activities) AND [2] present > 3 days  Answer Assessment - Initial Assessment Questions 1. ONSET: When did the pain start?     05/29/2024, one week ago 2. LOCATION: Where is the pain located?     Right shoulder pain 3. PAIN: How bad is the pain? (Scale 1-10; or mild, moderate, severe)     Flare up, moderate pain, very uncomfortable  4. WORK OR EXERCISE: Has there been any recent work or exercise that involved this part of the body?     Nothing out of the ordinary, does use arms daily for wheelchair mobility 5. CAUSE: What do you think is causing the shoulder pain?     After sitting in recliner for a long time, being sedentary, may have caused the flare up 6. OTHER SYMPTOMS: Do you have any other symptoms? (e.g., neck pain, swelling, rash, fever, numbness, weakness)     denies 7. PREGNANCY: Is there any chance you are pregnant? When was your last menstrual period?     N/A  Protocols used: Shoulder Pain-A-AH

## 2024-06-08 ENCOUNTER — Encounter: Payer: Self-pay | Admitting: Nurse Practitioner

## 2024-06-08 ENCOUNTER — Ambulatory Visit: Admitting: Nurse Practitioner

## 2024-06-08 ENCOUNTER — Ambulatory Visit: Payer: Self-pay | Admitting: Nurse Practitioner

## 2024-06-08 ENCOUNTER — Telehealth: Payer: Self-pay

## 2024-06-08 VITALS — BP 144/96 | HR 88 | Temp 97.4°F | Ht 62.0 in

## 2024-06-08 DIAGNOSIS — G8929 Other chronic pain: Secondary | ICD-10-CM | POA: Diagnosis not present

## 2024-06-08 DIAGNOSIS — Z136 Encounter for screening for cardiovascular disorders: Secondary | ICD-10-CM | POA: Diagnosis not present

## 2024-06-08 DIAGNOSIS — M898X1 Other specified disorders of bone, shoulder: Secondary | ICD-10-CM

## 2024-06-08 DIAGNOSIS — R7303 Prediabetes: Secondary | ICD-10-CM | POA: Diagnosis not present

## 2024-06-08 LAB — CBC WITH DIFFERENTIAL/PLATELET
Basophils Absolute: 0 K/uL (ref 0.0–0.1)
Basophils Relative: 0.6 % (ref 0.0–3.0)
Eosinophils Absolute: 0.2 K/uL (ref 0.0–0.7)
Eosinophils Relative: 3.9 % (ref 0.0–5.0)
HCT: 36.1 % (ref 36.0–46.0)
Hemoglobin: 11.3 g/dL — ABNORMAL LOW (ref 12.0–15.0)
Lymphocytes Relative: 25.7 % (ref 12.0–46.0)
Lymphs Abs: 1.4 K/uL (ref 0.7–4.0)
MCHC: 31.3 g/dL (ref 30.0–36.0)
MCV: 72.4 fl — ABNORMAL LOW (ref 78.0–100.0)
Monocytes Absolute: 0.4 K/uL (ref 0.1–1.0)
Monocytes Relative: 6.9 % (ref 3.0–12.0)
Neutro Abs: 3.4 K/uL (ref 1.4–7.7)
Neutrophils Relative %: 62.9 % (ref 43.0–77.0)
Platelets: 180 K/uL (ref 150.0–400.0)
RBC: 4.98 Mil/uL (ref 3.87–5.11)
RDW: 15.6 % — ABNORMAL HIGH (ref 11.5–15.5)
WBC: 5.5 K/uL (ref 4.0–10.5)

## 2024-06-08 LAB — COMPREHENSIVE METABOLIC PANEL WITH GFR
ALT: 17 U/L (ref 0–35)
AST: 16 U/L (ref 0–37)
Albumin: 4.2 g/dL (ref 3.5–5.2)
Alkaline Phosphatase: 88 U/L (ref 39–117)
BUN: 21 mg/dL (ref 6–23)
CO2: 28 meq/L (ref 19–32)
Calcium: 10 mg/dL (ref 8.4–10.5)
Chloride: 99 meq/L (ref 96–112)
Creatinine, Ser: 0.77 mg/dL (ref 0.40–1.20)
GFR: 81.59 mL/min (ref >60.00)
Glucose, Bld: 150 mg/dL — ABNORMAL HIGH (ref 70–99)
Potassium: 4 meq/L (ref 3.5–5.1)
Sodium: 136 meq/L (ref 135–145)
Total Bilirubin: 0.4 mg/dL (ref 0.2–1.2)
Total Protein: 7.7 g/dL (ref 6.0–8.3)

## 2024-06-08 LAB — LIPID PANEL
Cholesterol: 225 mg/dL — ABNORMAL HIGH (ref 0–200)
HDL: 46.3 mg/dL (ref >39.00)
LDL Cholesterol: 135 mg/dL — ABNORMAL HIGH (ref 0–99)
NonHDL: 178.76
Total CHOL/HDL Ratio: 5
Triglycerides: 218 mg/dL — ABNORMAL HIGH (ref 0.0–149.0)
VLDL: 43.6 mg/dL — ABNORMAL HIGH (ref 0.0–40.0)

## 2024-06-08 LAB — HEMOGLOBIN A1C: Hgb A1c MFr Bld: 8.6 % — ABNORMAL HIGH (ref 4.6–6.5)

## 2024-06-08 MED ORDER — TRIAMCINOLONE ACETONIDE 40 MG/ML IJ SUSP
40.0000 mg | Freq: Once | INTRAMUSCULAR | Status: AC
Start: 2024-06-08 — End: 2024-06-08
  Administered 2024-06-08: 40 mg via INTRAMUSCULAR

## 2024-06-08 MED ORDER — ROSUVASTATIN CALCIUM 10 MG PO TABS: 10.0000 mg | ORAL_TABLET | Freq: Every day | ORAL | 0 refills | Status: AC

## 2024-06-08 MED ORDER — TIZANIDINE HCL 4 MG PO TABS: 4.0000 mg | ORAL_TABLET | Freq: Three times a day (TID) | ORAL | 0 refills | Status: DC | PRN

## 2024-06-08 MED ORDER — METFORMIN HCL ER 500 MG PO TB24: 500.0000 mg | ORAL_TABLET | Freq: Every day | ORAL | 0 refills | Status: DC

## 2024-06-08 NOTE — Progress Notes (Unsigned)
   Acute Office Visit  Subjective:     Patient ID: Rhonda Keller, female    DOB: [birth year], 63 y.o.   MRN: [id number]  Chief Complaint  Patient presents with   Shoulder Pain    Right shoulder pain for 2 weeks    HPI Patient is in today for ***  ROS      Objective:    BP (!) 144/96 (BP Location: Left Arm, Patient Position: Sitting, Cuff Size: Large)   Pulse 88   Temp (!) 97.4 F (36.3 C)   Ht 5' 2 (1.575 m)   SpO2 94%   BMI 40.24 kg/m  {Vitals History (Optional):23777}  Physical Exam  No results found for any visits on 06/08/24.      Assessment & Plan:   Problem List Items Addressed This Visit   None   No orders of the defined types were placed in this encounter.   No follow-ups on file.  Rhonda DELENA Harada, NP

## 2024-06-08 NOTE — Telephone Encounter (Signed)
 I called and spoke with patient and notified her that Rx was sent to pharmacy. Thanks, BMS/CMA

## 2024-06-08 NOTE — Telephone Encounter (Signed)
 Copied from CRM #[id number]. Topic: Clinical - Medication Question >> Jun 08, 2024  2:14 PM Martinique E wrote: Reason for CRM: Patient prescribed tizanidine  today 9/11 at her visit. Patient spoke with the pharmacy and they did not get this prescription yet. Relayed to patient how a medication refill may take up to 3 business days, patient would like to know if the turnaround time is the same. Callback number [phone].

## 2024-06-08 NOTE — Patient Instructions (Signed)
 It was great to see you!  You can get a massage trigger point stick to help with massaging the area   Start tizanidine  every 8 hours as needed - this may make you sleepy   We are giving your a steroid injection today   Let's follow-up in 3 months, sooner if you have concerns.  If a referral was placed today, you will be contacted for an appointment. Please note that routine referrals can sometimes take up to 3-4 weeks to process. Please call our office if you haven't heard anything after this time frame.  Take care,  Tinnie Harada, NP

## 2024-06-09 DIAGNOSIS — R7303 Prediabetes: Secondary | ICD-10-CM | POA: Insufficient documentation

## 2024-06-09 NOTE — Assessment & Plan Note (Signed)
 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 A previous cortisone injection was effective. Administer a kenalog  40mg  IM for pain relief. Prescribe tizanidine  4mg  every eight hours as needed for muscle relaxation. Advise holding off on ibuprofen  for 24 hours post-injection, but continue acetaminophen  and tramadol  as needed. Suggest using a massage trigger point stick for self-massage and recommend alternating heat and cold therapy as needed. Consider physical therapy if pain persists.

## 2024-06-09 NOTE — Assessment & Plan Note (Signed)
 Chronic, ongoing. She has been trying to limit eating out. Check CMP, CBC, A1c today.

## 2024-06-18 ENCOUNTER — Other Ambulatory Visit: Payer: Self-pay | Admitting: Nurse Practitioner

## 2024-06-20 NOTE — Telephone Encounter (Signed)
 Requesting: TIZANIDINE  4MG  TABLETS  Last Visit: 06/08/2024 Next Visit: 09/11/2024 Last Refill: 06/08/2024  Please Advise

## 2024-07-04 ENCOUNTER — Other Ambulatory Visit: Payer: Self-pay | Admitting: Nurse Practitioner

## 2024-07-04 NOTE — Telephone Encounter (Signed)
 Requesting: TIZANIDINE  4MG  TABLETS  Last Visit: 06/08/2024 Next Visit: 09/11/2024 Last Refill: 06/20/2024  Please Advise

## 2024-07-20 ENCOUNTER — Other Ambulatory Visit: Payer: Self-pay | Admitting: Nurse Practitioner

## 2024-07-20 NOTE — Telephone Encounter (Signed)
 Requesting:  TIZANIDINE  4MG  TABLETS  Last Visit: 06/08/2024 Next Visit: 09/11/2024 Last Refill: 07/05/2024  Please Advise

## 2024-07-27 ENCOUNTER — Other Ambulatory Visit: Payer: Self-pay | Admitting: Nurse Practitioner

## 2024-08-23 ENCOUNTER — Telehealth: Payer: Self-pay

## 2024-08-23 ENCOUNTER — Other Ambulatory Visit: Payer: Self-pay

## 2024-08-23 MED ORDER — GABAPENTIN 300 MG PO CAPS: 300.0000 mg | ORAL_CAPSULE | Freq: Every day | ORAL | 0 refills | Status: DC

## 2024-08-23 NOTE — Telephone Encounter (Signed)
 Pt called and LVM stating she is unable to have a refill of Gabapentin  until December but is needing more at this time. Attempted to call pt for more information before consulting with MD. LVM for call back.

## 2024-08-23 NOTE — Telephone Encounter (Signed)
 S/w pt. She reports that she was advised by her PCP to increase frequency of her Gabapentin  when she was experiencing intermittent pain. She is no longer needing additional dose and is only taking 1 capsule at HS. She states it is too soon to refill her current rx. Per MD, ok to send short fill to her phx. Pt is aware and verbalized thanks.

## 2024-08-28 ENCOUNTER — Telehealth: Payer: Self-pay | Admitting: Nurse Practitioner

## 2024-08-28 NOTE — Telephone Encounter (Unsigned)
 Copied from CRM #[id number]. Topic: Clinical - Medication Refill >> Aug 28, 2024  1:51 PM Mesmerise C wrote: Medication: metFORMIN  (GLUCOPHAGE -XR) 500 MG 24 hr tablet  Has the patient contacted their pharmacy? No (Agent: If no, request that the patient contact the pharmacy for the refill. If patient does not wish to contact the pharmacy document the reason why and proceed with request.) (Agent: If yes, when and what did the pharmacy advise?)  This is the patient's preferred pharmacy:  [id number] - [place], [place] - [address] [place] [address] Phone: [phone] Fax: [phone]  Is this the correct pharmacy for this prescription? Yes If no, delete pharmacy and type the correct one.   Has the prescription been filled recently? No  Is the patient out of the medication? No  Has the patient been seen for an appointment in the last year OR does the patient have an upcoming appointment? Yes  Can we respond through MyChart? Yes  Agent: Please be advised that Rx refills may take up to 3 business days. We ask that you follow-up with your pharmacy.  Patient needs to reschedule her appt for 12/15 but wants to make sure her refill is approved before rescheduling and is too late

## 2024-08-29 MED ORDER — METFORMIN HCL ER 500 MG PO TB24: 500.0000 mg | ORAL_TABLET | Freq: Every day | ORAL | 0 refills | Status: AC

## 2024-09-04 ENCOUNTER — Other Ambulatory Visit: Payer: Self-pay | Admitting: Hematology and Oncology

## 2024-09-04 MED ORDER — GABAPENTIN 300 MG PO CAPS: 300.0000 mg | ORAL_CAPSULE | Freq: Every day | ORAL | 3 refills | Status: AC

## 2024-09-04 NOTE — Progress Notes (Signed)
 Gabapentin refilled

## 2024-09-11 ENCOUNTER — Ambulatory Visit: Admitting: Nurse Practitioner

## 2024-11-28 ENCOUNTER — Ambulatory Visit: Admitting: Nurse Practitioner

## 2024-11-30 ENCOUNTER — Inpatient Hospital Stay: Admitting: Hematology and Oncology

## 2024-11-30 ENCOUNTER — Inpatient Hospital Stay
